# Patient Record
Sex: Female | Born: 1950 | Race: White | Hispanic: No | Smoking: Never smoker
Health system: Southern US, Community
[De-identification: ages and names within clinical notes are randomized; demographics above are authoritative.]

## PROBLEM LIST (undated history)

## (undated) DIAGNOSIS — M810 Age-related osteoporosis without current pathological fracture: Secondary | ICD-10-CM

## (undated) DIAGNOSIS — R7303 Prediabetes: Secondary | ICD-10-CM

## (undated) DIAGNOSIS — M1990 Unspecified osteoarthritis, unspecified site: Secondary | ICD-10-CM

## (undated) DIAGNOSIS — K5792 Diverticulitis of intestine, part unspecified, without perforation or abscess without bleeding: Secondary | ICD-10-CM

## (undated) DIAGNOSIS — E039 Hypothyroidism, unspecified: Secondary | ICD-10-CM

## (undated) DIAGNOSIS — I499 Cardiac arrhythmia, unspecified: Secondary | ICD-10-CM

## (undated) DIAGNOSIS — K219 Gastro-esophageal reflux disease without esophagitis: Secondary | ICD-10-CM

## (undated) HISTORY — DX: Age-related osteoporosis without current pathological fracture: M81.0

## (undated) HISTORY — DX: Cardiac arrhythmia, unspecified: I49.9

## (undated) HISTORY — PX: DILATION AND CURETTAGE OF UTERUS: SHX78

## (undated) HISTORY — DX: Diverticulitis of intestine, part unspecified, without perforation or abscess without bleeding: K57.92

## (undated) HISTORY — DX: Hypothyroidism, unspecified: E03.9

---

## 2005-02-27 ENCOUNTER — Ambulatory Visit: Payer: Self-pay | Admitting: Unknown Physician Specialty

## 2006-07-01 ENCOUNTER — Other Ambulatory Visit: Payer: Self-pay

## 2006-07-07 ENCOUNTER — Ambulatory Visit: Payer: Self-pay | Admitting: Unknown Physician Specialty

## 2006-07-09 ENCOUNTER — Ambulatory Visit: Payer: Self-pay | Admitting: Unknown Physician Specialty

## 2007-07-21 ENCOUNTER — Ambulatory Visit: Payer: Self-pay | Admitting: Unknown Physician Specialty

## 2008-07-21 ENCOUNTER — Ambulatory Visit: Payer: Self-pay | Admitting: Unknown Physician Specialty

## 2008-08-01 ENCOUNTER — Ambulatory Visit: Payer: Self-pay | Admitting: Unknown Physician Specialty

## 2008-08-21 ENCOUNTER — Ambulatory Visit: Payer: Self-pay | Admitting: Unknown Physician Specialty

## 2009-07-25 ENCOUNTER — Ambulatory Visit: Payer: Self-pay | Admitting: Unknown Physician Specialty

## 2010-08-05 ENCOUNTER — Ambulatory Visit: Payer: Self-pay | Admitting: Unknown Physician Specialty

## 2011-08-27 ENCOUNTER — Ambulatory Visit: Payer: Self-pay | Admitting: Unknown Physician Specialty

## 2012-02-02 ENCOUNTER — Ambulatory Visit: Payer: Self-pay | Admitting: Gastroenterology

## 2012-02-05 ENCOUNTER — Ambulatory Visit: Payer: Self-pay | Admitting: Gastroenterology

## 2017-01-05 ENCOUNTER — Other Ambulatory Visit: Payer: Self-pay | Admitting: Family Medicine

## 2017-01-05 DIAGNOSIS — Z1231 Encounter for screening mammogram for malignant neoplasm of breast: Secondary | ICD-10-CM

## 2017-02-03 ENCOUNTER — Ambulatory Visit
Admission: RE | Admit: 2017-02-03 | Discharge: 2017-02-03 | Disposition: A | Discharge status: Home / Self Care | Payer: Medicare HMO | Source: Ambulatory Visit | Attending: Family Medicine | Admitting: Family Medicine

## 2017-02-03 ENCOUNTER — Ambulatory Visit (INDEPENDENT_AMBULATORY_CARE_PROVIDER_SITE_OTHER): Payer: Medicare HMO | Admitting: Obstetrics & Gynecology

## 2017-02-03 ENCOUNTER — Encounter: Payer: Self-pay | Admitting: Obstetrics & Gynecology

## 2017-02-03 VITALS — BP 120/80 | HR 68 | Ht 63.0 in | Wt 149.0 lb

## 2017-02-03 DIAGNOSIS — Z0000 Encounter for general adult medical examination without abnormal findings: Secondary | ICD-10-CM

## 2017-02-03 DIAGNOSIS — Z1231 Encounter for screening mammogram for malignant neoplasm of breast: Secondary | ICD-10-CM | POA: Insufficient documentation

## 2017-02-03 NOTE — Progress Notes (Signed)
HPI:      Ms. Becky Marshall is a 66 y.o. [id number] who LMP was in the past, she presents today for her annual examination.  The patient has no complaints today. The patient is not sexually active.  Husband dies this past 12/12/2022.  Her last pap: was normal. No HPV done last PAP last year. The patient is not taking hormone replacement therapy. Patient denies post-menopausal vaginal bleeding.   The patient has regular exercise: yes.   GYN Hx: Last Colonoscopy: unclear. Normal.  Last DEXA: 2 years ago.  Osteopenia.  PMHx: She  has a past medical history of Irregular heart beat. Also,  has a past surgical history that includes Dilation and curettage of uterus., family history includes Breast cancer in her paternal aunt.,  reports that she has never smoked. She has never used smokeless tobacco. She reports that she drinks alcohol. She reports that she does not use drugs.  She has a current medication list which includes the following prescription(s): prenat-fecbn-febisg-fa-fishoil, aspirin ec, atenolol, calcium carb-cholecalciferol, cetirizine, b-12, loperamide, melatonin, and probiotic product. Also, is allergic to hydrocodone.  Review of Systems  Constitutional: Negative for chills, fever and malaise/fatigue.  HENT: Negative for congestion, sinus pain and sore throat.   Eyes: Negative for blurred vision and pain.  Respiratory: Negative for cough and wheezing.   Cardiovascular: Negative for chest pain and leg swelling.  Gastrointestinal: Negative for abdominal pain, constipation, diarrhea, heartburn, nausea and vomiting.  Genitourinary: Negative for dysuria, frequency, hematuria and urgency.  Musculoskeletal: Negative for back pain, joint pain, myalgias and neck pain.  Skin: Negative for itching and rash.  Neurological: Negative for dizziness, tremors and weakness.  Endo/Heme/Allergies: Does not bruise/bleed easily.  Psychiatric/Behavioral: Negative for depression. The patient is not  nervous/anxious and does not have insomnia.     Objective: BP 120/80   Pulse 68   Ht 5\' 3"  (1.6 m)   Wt 149 lb (67.6 kg)   BMI 26.39 kg/m  Physical Exam  Constitutional: She is oriented to person, place, and time. She appears well-developed and well-nourished. No distress.  Genitourinary: Rectum normal, vagina normal and uterus normal. Pelvic exam was performed with patient supine. There is no rash or lesion on the right labia. There is no rash or lesion on the left labia. Vagina exhibits no lesion. No bleeding in the vagina. Right adnexum does not display mass and does not display tenderness. Left adnexum does not display mass and does not display tenderness. Cervix does not exhibit motion tenderness, lesion, friability or polyp.   Uterus is mobile and midaxial. Uterus is not enlarged or exhibiting a mass.  HENT:  Head: Normocephalic and atraumatic. Head is without laceration.  Right Ear: Hearing normal.  Left Ear: Hearing normal.  Nose: No epistaxis.  No foreign bodies.  Mouth/Throat: Uvula is midline, oropharynx is clear and moist and mucous membranes are normal.  Eyes: Pupils are equal, round, and reactive to light.  Neck: Normal range of motion. Neck supple. No thyromegaly present.  Cardiovascular: Normal rate and regular rhythm.  Exam reveals no gallop and no friction rub.   No murmur heard. Pulmonary/Chest: Effort normal and breath sounds normal. No respiratory distress. She has no wheezes.  Abdominal: Soft. Bowel sounds are normal. She exhibits no distension. There is no tenderness. There is no rebound.  Musculoskeletal: Normal range of motion.  Neurological: She is alert and oriented to person, place, and time. No cranial nerve deficit.  Skin: Skin is warm and dry.  Psychiatric:  She has a normal mood and affect. Judgment normal.  Vitals reviewed.   Assessment: Annual Exam 1. Annual physical exam    Plan:            1.  Cervical Screening-  Pap smear done today  2.  Breast screening- Exam annually and mammogram done today  3. Colonoscopy every 10 years, Hemoccult testing yearly  4. Labs per PCP   5. Counseling for hormonal therapy: none\  6. DEXA due next year     F/U  Return in about 1 year (around 02/03/2018) for Annual with Mammogram.  Annamarie MajorPaul Ethin Drummond, MD, Merlinda FrederickFACOG [facility], [facility] 02/03/2017  2:00 PM

## 2017-02-06 LAB — IGP, APTIMA HPV
HPV Aptima: NEGATIVE
PAP Smear Comment: 0

## 2017-02-28 LAB — FECAL OCCULT BLOOD, IMMUNOCHEMICAL: FECAL OCCULT BLD: NEGATIVE

## 2017-08-13 NOTE — Discharge Instructions (Signed)
[facility] °[facility] ° °POST OPERATIVE INSTRUCTIONS FOR DR. TROXLER AND DR. FOWLER °[facility] ° ° °1. Take your medication as prescribed.  Pain medication should be taken only as needed. ° °2. Keep the dressing clean, dry and intact. ° °3. Keep your foot elevated above the heart level for the first 48 hours. ° °4. Walking to the bathroom and brief periods of walking are acceptable, unless we have instructed you to be non-weight bearing. ° °5. Always wear your post-op shoe when walking.  Always use your crutches if you are to be non-weight bearing. ° °6. Do not take a shower. Baths are permissible as long as the foot is kept out of the water.  ° °7. Every hour you are awake:  °- Bend your knee 15 times. °- Flex foot 15 times °- Massage calf 15 times ° °8. Call [facility] ([phone]) if any of the following problems occur: °- You develop a temperature or fever. °- The bandage becomes saturated with blood. °- Medication does not stop your pain. °- Injury of the foot occurs. °- Any symptoms of infection including redness, odor, or red streaks running from wound. ° ° °General Anesthesia, Adult, Care After °These instructions provide you with information about caring for yourself after your procedure. Your health care provider may also give you more specific instructions. Your treatment has been planned according to current medical practices, but problems sometimes occur. Call your health care provider if you have any problems or questions after your procedure. °What can I expect after the procedure? °After the procedure, it is common to have: °· Vomiting. °· A sore throat. °· Mental slowness. ° °It is common to feel: °· Nauseous. °· Cold or shivery. °· Sleepy. °· Tired. °· Sore or achy, even in parts of your body where you did not have surgery. ° °Follow these instructions at home: °For at least 24 hours after the procedure: °· Do not: °? Participate in  activities where you could fall or become injured. °? Drive. °? Use heavy machinery. °? Drink alcohol. °? Take sleeping pills or medicines that cause drowsiness. °? Make important decisions or sign legal documents. °? Take care of children on your own. °· Rest. °Eating and drinking °· If you vomit, drink water, juice, or soup when you can drink without vomiting. °· Drink enough fluid to keep your urine clear or pale yellow. °· Make sure you have little or no nausea before eating solid foods. °· Follow the diet recommended by your health care provider. °General instructions °· Have a responsible adult stay with you until you are awake and alert. °· Return to your normal activities as told by your health care provider. Ask your health care provider what activities are safe for you. °· Take over-the-counter and prescription medicines only as told by your health care provider. °· If you smoke, do not smoke without supervision. °· Keep all follow-up visits as told by your health care provider. This is important. °Contact a health care provider if: °· You continue to have nausea or vomiting at home, and medicines are not helpful. °· You cannot drink fluids or start eating again. °· You cannot urinate after 8-12 hours. °· You develop a skin rash. °· You have fever. °· You have increasing redness at the site of your procedure. °Get help right away if: °· You have difficulty breathing. °· You have chest pain. °· You have unexpected bleeding. °· You feel that you   are having a life-threatening or urgent problem. °This information is not intended to replace advice given to you by your health care provider. Make sure you discuss any questions you have with your health care provider. °Document Released: 02/09/2001 Document Revised: 04/07/2016 Document Reviewed: 10/18/2015 °Elsevier Interactive Patient Education © 2018 [employer] ° °

## 2017-08-14 ENCOUNTER — Encounter: Payer: Self-pay | Admitting: Anesthesiology

## 2017-08-20 ENCOUNTER — Ambulatory Visit: Payer: Medicare HMO | Admitting: Anesthesiology

## 2017-08-20 ENCOUNTER — Encounter
Admission: RE | Disposition: A | Discharge status: Home / Self Care | Payer: Self-pay | Source: Ambulatory Visit | Attending: Podiatry

## 2017-08-20 ENCOUNTER — Ambulatory Visit
Admission: RE | Admit: 2017-08-20 | Discharge: 2017-08-20 | Disposition: A | Discharge status: Home / Self Care | Payer: Medicare HMO | Source: Ambulatory Visit | Attending: Podiatry | Admitting: Podiatry

## 2017-08-20 DIAGNOSIS — Z79899 Other long term (current) drug therapy: Secondary | ICD-10-CM | POA: Insufficient documentation

## 2017-08-20 DIAGNOSIS — M19071 Primary osteoarthritis, right ankle and foot: Secondary | ICD-10-CM | POA: Insufficient documentation

## 2017-08-20 DIAGNOSIS — Z7982 Long term (current) use of aspirin: Secondary | ICD-10-CM | POA: Diagnosis not present

## 2017-08-20 DIAGNOSIS — K5730 Diverticulosis of large intestine without perforation or abscess without bleeding: Secondary | ICD-10-CM | POA: Diagnosis not present

## 2017-08-20 DIAGNOSIS — S93144D Subluxation of metatarsophalangeal joint of right lesser toe(s), subsequent encounter: Secondary | ICD-10-CM | POA: Diagnosis not present

## 2017-08-20 DIAGNOSIS — I10 Essential (primary) hypertension: Secondary | ICD-10-CM | POA: Insufficient documentation

## 2017-08-20 DIAGNOSIS — M2011 Hallux valgus (acquired), right foot: Secondary | ICD-10-CM

## 2017-08-20 DIAGNOSIS — Q6621 Congenital metatarsus primus varus: Secondary | ICD-10-CM | POA: Diagnosis present

## 2017-08-20 DIAGNOSIS — E0500 Thyrotoxicosis with diffuse goiter without thyrotoxic crisis or storm: Secondary | ICD-10-CM | POA: Insufficient documentation

## 2017-08-20 DIAGNOSIS — G8929 Other chronic pain: Secondary | ICD-10-CM | POA: Insufficient documentation

## 2017-08-20 HISTORY — PX: HALLUX VALGUS LAPIDUS: SHX6626

## 2017-08-20 HISTORY — DX: Prediabetes: R73.03

## 2017-08-20 HISTORY — DX: Unspecified osteoarthritis, unspecified site: M19.90

## 2017-08-20 HISTORY — PX: WEIL OSTEOTOMY: SHX5044

## 2017-08-20 HISTORY — DX: Gastro-esophageal reflux disease without esophagitis: K21.9

## 2017-08-20 HISTORY — PX: CAPSULOTOMY METATARSOPHALANGEAL: SHX6614

## 2017-08-20 SURGERY — BUNIONECTOMY, LAPIDUS
Anesthesia: Regional | Laterality: Right

## 2017-08-20 MED ORDER — LACTATED RINGERS IV SOLN: INTRAVENOUS | Status: DC

## 2017-08-20 MED ORDER — MIDAZOLAM HCL 5 MG/5ML IJ SOLN
INTRAMUSCULAR | Status: DC | PRN
  Administered 2017-08-20: 1 mg via INTRAVENOUS

## 2017-08-20 MED ORDER — ENOXAPARIN SODIUM 40 MG/0.4ML ~~LOC~~ SOLN: 40.0000 mg | SUBCUTANEOUS | 0 refills | Status: DC

## 2017-08-20 MED ORDER — ONDANSETRON HCL 4 MG/2ML IJ SOLN: 4.0000 mg | Freq: Once | INTRAMUSCULAR | Status: DC | PRN

## 2017-08-20 MED ORDER — DEXAMETHASONE SODIUM PHOSPHATE 4 MG/ML IJ SOLN
INTRAMUSCULAR | Status: DC | PRN
  Administered 2017-08-20: 4 mg via INTRAVENOUS
  Administered 2017-08-20: 4 mg via PERINEURAL

## 2017-08-20 MED ORDER — LIDOCAINE HCL (CARDIAC) 20 MG/ML IV SOLN
INTRAVENOUS | Status: DC | PRN
  Administered 2017-08-20: 40 mg via INTRATRACHEAL

## 2017-08-20 MED ORDER — PROPOFOL 10 MG/ML IV BOLUS
INTRAVENOUS | Status: DC | PRN
  Administered 2017-08-20: 130 mg via INTRAVENOUS

## 2017-08-20 MED ORDER — BUPIVACAINE HCL (PF) 0.25 % IJ SOLN
INTRAMUSCULAR | Status: DC | PRN
  Administered 2017-08-20: 10 mL

## 2017-08-20 MED ORDER — EPHEDRINE SULFATE 50 MG/ML IJ SOLN
INTRAMUSCULAR | Status: DC | PRN
  Administered 2017-08-20: 5 mg via INTRAVENOUS
  Administered 2017-08-20 (×2): 10 mg via INTRAVENOUS
  Administered 2017-08-20 (×5): 5 mg via INTRAVENOUS

## 2017-08-20 MED ORDER — ROPIVACAINE HCL 5 MG/ML IJ SOLN
INTRAMUSCULAR | Status: DC | PRN
  Administered 2017-08-20: 30 mL via PERINEURAL

## 2017-08-20 MED ORDER — FENTANYL CITRATE (PF) 100 MCG/2ML IJ SOLN: 25.0000 ug | INTRAMUSCULAR | Status: DC | PRN

## 2017-08-20 MED ORDER — LACTATED RINGERS IV SOLN
INTRAVENOUS | Status: DC
  Administered 2017-08-20 (×2): via INTRAVENOUS

## 2017-08-20 MED ORDER — ONDANSETRON HCL 4 MG/2ML IJ SOLN
INTRAMUSCULAR | Status: DC | PRN
  Administered 2017-08-20: 4 mg via INTRAVENOUS

## 2017-08-20 MED ORDER — OXYCODONE-ACETAMINOPHEN 7.5-325 MG PO TABS: 1.0000 | ORAL_TABLET | ORAL | 0 refills | Status: DC | PRN

## 2017-08-20 MED ORDER — ONDANSETRON HCL 4 MG PO TABS: 4.0000 mg | ORAL_TABLET | Freq: Three times a day (TID) | ORAL | 0 refills | Status: DC | PRN

## 2017-08-20 MED ORDER — CEFAZOLIN SODIUM-DEXTROSE 2-4 GM/100ML-% IV SOLN
2.0000 g | Freq: Once | INTRAVENOUS | Status: AC
  Administered 2017-08-20: 2 g via INTRAVENOUS

## 2017-08-20 MED ORDER — GLYCOPYRROLATE 0.2 MG/ML IJ SOLN
INTRAMUSCULAR | Status: DC | PRN
  Administered 2017-08-20: 0.1 mg via INTRAVENOUS

## 2017-08-20 SURGICAL SUPPLY — 100 items
0.9MM ×12 IMPLANT
BANDAGE ELASTIC 4 LF NS (GAUZE/BANDAGES/DRESSINGS) ×3 IMPLANT
BENZOIN TINCTURE PRP APPL 2/3 (GAUZE/BANDAGES/DRESSINGS) ×3 IMPLANT
BIT DRILL 1.7 LNG CANN (DRILL) ×3 IMPLANT
BIT DRILL 2 FENESTRATED (MISCELLANEOUS) ×1 IMPLANT
BIT DRILL 2.4X140 LONG SOLID (BIT) ×3 IMPLANT
BIT DRILL CANNULTD 2.6 X 130MM (DRILL) ×1 IMPLANT
BIT DRILL WIRE PASS (DRILL) ×1 IMPLANT
BIT DRILLL 2 FENESTRATED (MISCELLANEOUS) ×2
BLADE CRESCENTIC (BLADE) IMPLANT
BLADE MED AGGRESSIVE (BLADE) IMPLANT
BLADE MINI RND TIP GREEN BEAV (BLADE) ×3 IMPLANT
BLADE OSC/SAGITTAL 5.5X25 (BLADE) ×3 IMPLANT
BLADE OSC/SAGITTAL MD 5.5X18 (BLADE) IMPLANT
BLADE OSC/SAGITTAL MD 9X18.5 (BLADE) IMPLANT
BLADE OSCILLATING/SAGITTAL (BLADE)
BLADE SURG 15 STRL LF DISP TIS (BLADE) IMPLANT
BLADE SURG 15 STRL SS (BLADE)
BLADE SW THK.38XMED LNG THN (BLADE) IMPLANT
BNDG ESMARK 4X12 TAN STRL LF (GAUZE/BANDAGES/DRESSINGS) ×3 IMPLANT
BNDG GAUZE 4.5X4.1 6PLY STRL (MISCELLANEOUS) ×3 IMPLANT
BNDG STRETCH 4X75 STRL LF (GAUZE/BANDAGES/DRESSINGS) ×3 IMPLANT
BUR EGG 4X8 MED (BURR) IMPLANT
BUR STRYKR EGG 5.0 (BURR) IMPLANT
BUR SURG 4X8 MED (BURR) ×1 IMPLANT
BURR SURG 4MMX8MM MEDIUM (BURR) ×1
BURR SURG 4X8 MED (BURR) ×2
CANISTER SUCT 1200ML W/VALVE (MISCELLANEOUS) ×3 IMPLANT
CAST PADDING 3X4FT ST 30246 (SOFTGOODS)
CHLORAPREP W/TINT 26ML (MISCELLANEOUS) ×3 IMPLANT
CLOSURE WOUND 1/4X4 (GAUZE/BANDAGES/DRESSINGS) ×1
CNTRSNK DRL 2 HDLS SCR (MISCELLANEOUS) ×1 IMPLANT
COUNTERSICK 4.0 HEADED (MISCELLANEOUS) ×3
COUNTERSINK 2.0 (MISCELLANEOUS) ×2
COVER LIGHT HANDLE UNIVERSAL (MISCELLANEOUS) ×6 IMPLANT
COVER PIN YLW 0.028-062 (MISCELLANEOUS) IMPLANT
CUFF TOURN SGL QUICK 18 (TOURNIQUET CUFF) ×3 IMPLANT
DRAPE FLUOR MINI C-ARM 54X84 (DRAPES) ×3 IMPLANT
DRILL CANNULATED 2.6 X 130MM (DRILL) ×3
DRILL WIRE PASS (DRILL) ×3 IMPLANT
DURAPREP 26ML APPLICATOR (WOUND CARE) ×3 IMPLANT
ETHIBOND 2 0 GREEN CT 2 30IN (SUTURE) ×3 IMPLANT
GAUZE PETRO XEROFOAM 1X8 (MISCELLANEOUS) ×3 IMPLANT
GAUZE SPONGE 4X4 12PLY STRL (GAUZE/BANDAGES/DRESSINGS) ×3 IMPLANT
GLOVE BIO SURGEON STRL SZ8 (GLOVE) ×3 IMPLANT
GOWN STRL REUS W/ TWL LRG LVL3 (GOWN DISPOSABLE) IMPLANT
GOWN STRL REUS W/ TWL XL LVL3 (GOWN DISPOSABLE) ×2 IMPLANT
GOWN STRL REUS W/TWL LRG LVL3 (GOWN DISPOSABLE)
GOWN STRL REUS W/TWL XL LVL3 (GOWN DISPOSABLE) ×4
IV NS 250ML (IV SOLUTION)
IV NS 250ML BAXH (IV SOLUTION) IMPLANT
K-WIRE DBL END TROCAR 6X.045 (WIRE)
K-WIRE DBL END TROCAR 6X.062 (WIRE)
K-WIRE SNGL END 1.2X150 (MISCELLANEOUS) ×30
KIT ROOM TURNOVER OR (KITS) ×3 IMPLANT
KWIRE DBL END TROCAR 6X.045 (WIRE) IMPLANT
KWIRE DBL END TROCAR 6X.062 (WIRE) IMPLANT
KWIRE SNGL END 1.2X150 (MISCELLANEOUS) ×10 IMPLANT
NEEDLE HYPO 18GX1.5 BLUNT FILL (NEEDLE) IMPLANT
NEEDLE HYPO 25GX1X1/2 BEV (NEEDLE) IMPLANT
NS IRRIG 500ML POUR BTL (IV SOLUTION) ×3 IMPLANT
PACK EXTREMITY ARMC (MISCELLANEOUS) ×3 IMPLANT
PAD CAST CTTN 3X4 STRL (SOFTGOODS) IMPLANT
PAD GROUND ADULT SPLIT (MISCELLANEOUS) ×3 IMPLANT
PADDING CAST BLEND 4X4 NS (MISCELLANEOUS) ×3 IMPLANT
PLATE STD RIGHT LAPIDUS (Plate) ×1 IMPLANT
RASP SM TEAR CROSS CUT (RASP) ×3 IMPLANT
SCREW 3.5X16 NONLOCKING (Screw) ×3 IMPLANT
SCREW CANNULATED 4.0X36S ×3 IMPLANT
SCREW COUNTERSINK 4.0 HEADED (MISCELLANEOUS) ×1 IMPLANT
SCREW HEADLESS SHRT THRD 2X10 (Screw) ×3 IMPLANT
SCREW HEADLESS SHRT THRD 2X12 (Screw) ×9 IMPLANT
SCREW LOCK PLATE R3 3.5X14 (Screw) ×6 IMPLANT
SCREW LOCK PLATE R3 3.5X16 (Screw) ×6 IMPLANT
SPLINT CAST 1 STEP 4X30 (MISCELLANEOUS) ×3 IMPLANT
SPLINT FAST PLASTER 5X30 (CAST SUPPLIES)
SPLINT PLASTER CAST FAST 5X30 (CAST SUPPLIES) IMPLANT
STANDARD R LAPIDUS PLATE (Plate) ×3 IMPLANT
STOCKINETTE STRL 6IN 960660 (GAUZE/BANDAGES/DRESSINGS) ×3 IMPLANT
STRAP BODY AND KNEE 60X3 (MISCELLANEOUS) ×3 IMPLANT
STRIP CLOSURE SKIN 1/4X4 (GAUZE/BANDAGES/DRESSINGS) ×2 IMPLANT
SUT ETHILON 4-0 (SUTURE)
SUT ETHILON 4-0 FS2 18XMFL BLK (SUTURE)
SUT ETHILON 5-0 FS-2 18 BLK (SUTURE) ×3 IMPLANT
SUT SILK 2 0 (SUTURE)
SUT SILK 2-0 30XBRD TIE 12 (SUTURE) IMPLANT
SUT VIC AB 1 CT1 36 (SUTURE) IMPLANT
SUT VIC AB 2-0 CT1 27 (SUTURE)
SUT VIC AB 2-0 CT1 TAPERPNT 27 (SUTURE) IMPLANT
SUT VIC AB 2-0 SH 27 (SUTURE)
SUT VIC AB 2-0 SH 27XBRD (SUTURE) IMPLANT
SUT VIC AB 3-0 SH 27 (SUTURE) ×2
SUT VIC AB 3-0 SH 27X BRD (SUTURE) ×1 IMPLANT
SUT VIC AB 4-0 FS2 27 (SUTURE) ×6 IMPLANT
SUT VICRYL AB 3-0 FS1 BRD 27IN (SUTURE) ×3 IMPLANT
SUTURE ETHLN 4-0 FS2 18XMF BLK (SUTURE) IMPLANT
SYRINGE 10CC LL (SYRINGE) IMPLANT
WAND TENDON TOPAZ 0 ANGL (MISCELLANEOUS) IMPLANT
WIRE OLIVE SMOOTH 1.4MMX60MM (WIRE) ×12 IMPLANT
kwire ×6 IMPLANT

## 2017-08-20 NOTE — Anesthesia Postprocedure Evaluation (Signed)
Anesthesia Post Note  Patient: Becky Marshall  Procedure(s) Performed: HALLUX VALGUS LAPIDUS (Right ) WEIL OSTEOTOMY-2ND & 3RD TOES (Right ) CAPSULOTOMY METATARSOPHALANGEAL-2ND & 3RD (Right )  Patient location during evaluation: PACU Anesthesia Type: Regional Level of consciousness: awake and alert Pain management: pain level controlled Vital Signs Assessment: post-procedure vital signs reviewed and stable Respiratory status: spontaneous breathing Cardiovascular status: blood pressure returned to baseline Postop Assessment: no headache Anesthetic complications: no    Verner Chol, III,  Gabrielle Wakeland D

## 2017-08-20 NOTE — H&P (Signed)
H and P has been reviewed and no changes are noted.  

## 2017-08-20 NOTE — Transfer of Care (Signed)
Immediate Anesthesia Transfer of Care Note  Patient: Becky Marshall  Procedure(s) Performed: HALLUX VALGUS LAPIDUS (Right ) WEIL OSTEOTOMY-2ND & 3RD TOES (Right ) CAPSULOTOMY METATARSOPHALANGEAL-2ND & 3RD (Right )  Patient Location: PACU  Anesthesia Type: General LMA, Regional  Level of Consciousness: awake, alert  and patient cooperative  Airway and Oxygen Therapy: Patient Spontanous Breathing and Patient connected to supplemental oxygen  Post-op Assessment: Post-op Vital signs reviewed, Patient's Cardiovascular Status Stable, Respiratory Function Stable, Patent Airway and No signs of Nausea or vomiting  Post-op Vital Signs: Reviewed and stable  Complications: No apparent anesthesia complications

## 2017-08-20 NOTE — Anesthesia Procedure Notes (Signed)
Anesthesia Regional Block: Popliteal block   Pre-Anesthetic Checklist: ,, timeout performed, Correct Patient, Correct Site, Correct Laterality, Correct Procedure, Correct Position, site marked, Risks and benefits discussed,  Surgical consent,  Pre-op evaluation,  At surgeon's request and post-op pain management  Laterality: Right  Prep: chloraprep       Needles:  Injection technique: Single-shot  Needle Type: Stimiplex      Needle Gauge: 21     Additional Needles:   Procedures:,,,, ultrasound used (permanent image in chart),,,,  Narrative:  Start time: 08/20/2017 7:25 AM End time: 08/20/2017 7:30 AM Injection made incrementally with aspirations every 30 mL.  Performed by: Personally  Anesthesiologist: Jolayne Panther

## 2017-08-20 NOTE — Anesthesia Preprocedure Evaluation (Signed)
Anesthesia Evaluation  Patient identified by MRN, date of birth, ID band Patient awake    Reviewed: Allergy & Precautions, H&P , Patient's Chart, lab work & pertinent test results  Airway Mallampati: II  TM Distance: >3 FB Neck ROM: full    Dental no notable dental hx.    Pulmonary neg pulmonary ROS,    Pulmonary exam normal        Cardiovascular negative cardio ROS Normal cardiovascular exam     Neuro/Psych    GI/Hepatic Neg liver ROS, Medicated,  Endo/Other  negative endocrine ROS  Renal/GU negative Renal ROS     Musculoskeletal   Abdominal   Peds  Hematology negative hematology ROS (+)   Anesthesia Other Findings   Reproductive/Obstetrics negative OB ROS                            Anesthesia Physical Anesthesia Plan  ASA: II  Anesthesia Plan: General LMA and Regional   Post-op Pain Management: GA combined w/ Regional for post-op pain   Induction:   PONV Risk Score and Plan:   Airway Management Planned:   Additional Equipment:   Intra-op Plan:   Post-operative Plan:   Informed Consent: I have reviewed the patients History and Physical, chart, labs and discussed the procedure including the risks, benefits and alternatives for the proposed anesthesia with the patient or authorized representative who has indicated his/her understanding and acceptance.     Plan Discussed with:   Anesthesia Plan Comments:         Anesthesia Quick Evaluation

## 2017-08-20 NOTE — Anesthesia Procedure Notes (Signed)
Procedure Name: LMA Insertion Date/Time: 08/20/2017 7:49 AM Performed by: Jimmy Picket Pre-anesthesia Checklist: Patient identified, Emergency Drugs available, Suction available, Timeout performed and Patient being monitored Patient Re-evaluated:Patient Re-evaluated prior to induction Oxygen Delivery Method: Circle system utilized Preoxygenation: Pre-oxygenation with 100% oxygen Induction Type: IV induction LMA: LMA inserted LMA Size: 4.0 Number of attempts: 1 Placement Confirmation: positive ETCO2 and breath sounds checked- equal and bilateral Tube secured with: Tape

## 2017-08-20 NOTE — Progress Notes (Signed)
Assisted Daniel Runkle ANMD with right, ultrasound guided, popliteal block. Side rails up, monitors on throughout procedure. See vital signs in flow sheet. Tolerated Procedure well.  

## 2017-08-20 NOTE — Op Note (Signed)
Operative note   Surgeon: Dr. Albertine Patricia, DPM.    Assistant: None    Preop diagnosis: 1. 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 Osteoarthritis first metatarsocuneiform joint right foot. 3. Plantar displaced second metatarsal right foot 4. Plantar displaced third metatarsal right foot 5. Ruptured second metatarsal phalangeal joint plantar plate with dorsal dislocation of the proximal phalanx 6. Ruptured third metatarsal phalangeal joint plantar plate with dorsal dislocation of the proximal phalanx base    Postop diagnosis: Same    Procedure:   1. Hallux abductovalgus correction with Lapidus fusion of the first met cuneiform joint right foot   2. Osteotomy second metatarsal right foot   3. Osteotomy third metatarsal right foot    4. Plantar plate repair through the dorsal second MTPJ incision with anchoring of the plate to the proximal phalanx base 5. Plantar plate repair through the dorsal MTPJ incision of the third MTP joint with anchoring to the proximal phalanx base    EBL: 15 cc    Anesthesia:general delivered by anesthesia team with popliteal block preoperatively also delivered by anesthesia team. I block the dorsal aspect of the foot at the end of the case with 0.25% Marcaine plain 10 cc    Hemostasis: Thigh tourniquet at 250 mils mercury pressure. This was initially inflated for 70 minutes then dropped for 10 minutes then inflated again for another 70 minutes before dropping again. Then inflated for 30 minutes. Before dropping permanently. At each phase prompt complete vascularity was seen to return to all the digits.    Specimen: None    Complications: None    Operative indications: Patient chronic pain to the right foot she seen more deformity the toes over the last 5 or 6 years unresponsive to conservative care    Procedure:  Patient was brought into the OR and placed on the operating table in thesupine position. After anesthesia was obtained  theright lower extremity was prepped and draped in usual sterile fashion.  Operative Report: At this time to his directed to the dorsum of the right foot where a 7 cm dorsolinear skin incision made over the first met cuneiform joint extended distally over the first MTP joint. Incision was deepened sharp dissection bleeders clamped and bovied as required. At the MTP joint level the capsule tissue was identified and incised longitudinally reflected with the medial aspect of the metatarsal head were large eminence of bone was noted. This resected and rasped smoothly to this timeframe. Next the incision was deepened at the met cuneiform joint level where tissues were retracted mediolaterally. First met cuneiform joint was identified and a cutting guide was used from the Paragon Lapidus set to allow for resection of the articular cartilage from both aspects of the joint. This time the cutting guide was anchored and checked FluoroScan good position was noted the cuts were then made and the articular cartilage was removed with set. A spreader was placed across each side so that good visualization could be accomplished and the residual bone and cartilage was removed. Curettage was used to remove the [place] portion of cartilage from the base of the metatarsal as well as a bur. Once good raw bone was noted on each side the area was then drilled multiple times and fenestrated. Once this was accomplished the 2 portions of bone were reapproximated and the first metatarsal was rotated in the frontal plane and temporarily pinned. There is checked forcing good position and correction were noted with good reduction of the IM  angle as well as the sesamoid position was improved considerably. At this time the plating system was placed in the correct position fixated with 2 olive wires and the 40 screw was then positioned across the first metatarsal dorsally to proximal plantar through the medial cuneiform. Once this was in position  and fixated there is checked FluoroScan good position and fixation were noted. This point the plate was then fixated dorsal medial and medial to the fused joint area. With 2 screws across the distal portion and one screw at the medial distal portion. 2 screws were also placed proximally. There is checked FluoroScan excellent position correction were noted. There is a complete closure of the plantar gap that had been present originally and good alignment in all planes. There is an copiously irrigated.  This time to his directed to the lateral aspect of the first MTP joint where a lateral capsulotomy was then performed and a fibular sesamoidal ligament release was performed. A medial capsulorrhaphy was then performed to help with position of the proximal phalanx on the metatarsal head. There is checked FluoroScan good position correction were noted this timeframe.  This time remaining capsule tissue was closed with 4 Vicryl in continuous stitch as were deep superficial fascial layers. Skin was closed with 4 Vicryl in a subcuticular stitch.  Procedure #2 and 3.:  A curvilinear incision made over the dorsum of the second metatarsal phalangeal joint extended up onto the toe. Incision was deepened sharp blunt dissection bleeders clamped and bovied as required. Extension was identified and extensor hood release was performed of the tendon was retracted laterally. Incision made through the periosteum Tissue over the distal metatarsal and metatarsophalangeal joint extending up onto the base of the proximal phalanx. This tissues and freed mediolaterally and 15 blade. There is a this time that the proximal phalanx was dislocated and sitting dorsally on the metatarsal head. Family elevator was used to free up tissue and an osteotomy was then performed on second metatarsal at the osteochondral junction from dorsal distal to plantar proximal. On the second metatarsal a secondary small shaver was accomplished to allow  dorsiflexion and prevent excess plantarflexion with shortening of the osteotomy. At this time a spreader was used with 2 pins to open up the MTP joint more and it was clear that a plantar plate rupture had occurred. The flexor tendons were identified and present within the MTP joint visibility. The proximal portion of the ruptured plate was then identified and a 2-0 nonabsorbable suture was utilized to pick the proximal portion of the torn plantar plate medially and then this was also used to catch the proximal lateral aspect of it and this portion of the plantar plate was isolated in this fashion. At this 0.2 drill holes were placed across proximal phalanx base with 1.5 wire pass drill bit. The ends of the 2-0 suture were then placed across the hole in the wire pass drill and brought from plantar to dorsal at the base of the proximal phalanx. This was accomplished on both drill holes with both ends of the suture. Suture was then used to tighten down the proximal phalanx base and to bring the damaged portion of plate back up to a point where it would cover the tendons and reattached to the proximal phalanx base. Once this was accomplished the toe was seen to sit in a much better position with no further contracture across the PIP joint of the toe. The alignment in the sagittal plane and frontal plane and  transverse planes was excellent on the area. There is checked FluoroScan good position correction were noted. This point the osteotomy was positioned temporally fixated with 2 K wires and there is checked FluoroScan good position correction were noted and then 2 of the screws from the mini monster Paragon set were placed across the osteotomy. At this time some 3-0 Vicryl was used to further repair the plantar plate. After copious irrigation the periosteal tissues and closed over the second metatarsal phalangeal joint with 4 Vicryl in continuous stitch deep suprafascial layers closed with 4 Vicryl in continuous  stitch. Skin was closed with 4 Vicryl subcuticular fashion. A K wire was then run across the toe starting distally and ran through the distal middle proximal phalanges and retrograded into the metatarsal head and shaft to help hold the toe in a better overall position as well and secured the area and a good position.  Procedures 4 and 5: This time attention was directed to the third metatarsal phalangeal joint where identical procedure was performed.  After the Marcaine block a sterile compressive dressings placed across the wounds consisting of Xeroform gauze Steri-Strips 4 x 4's Kling and Kerlix tourniquet was released as noted earlier.    Patient tolerated the procedure and anesthesia well.  Was transported from the OR to the PACU with all vital signs stable and vascular status intact. To be discharged per routine protocol.  Will follow up in approximately 1 week in the outpatient clinic.

## 2017-08-21 ENCOUNTER — Encounter: Payer: Self-pay | Admitting: Podiatry

## 2018-06-02 ENCOUNTER — Ambulatory Visit (INDEPENDENT_AMBULATORY_CARE_PROVIDER_SITE_OTHER): Payer: Medicare HMO | Admitting: Obstetrics & Gynecology

## 2018-06-02 ENCOUNTER — Encounter: Payer: Self-pay | Admitting: Obstetrics & Gynecology

## 2018-06-02 VITALS — BP 132/80 | HR 73 | Ht 62.5 in | Wt 163.0 lb

## 2018-06-02 DIAGNOSIS — Z1231 Encounter for screening mammogram for malignant neoplasm of breast: Secondary | ICD-10-CM

## 2018-06-02 DIAGNOSIS — Z8739 Personal history of other diseases of the musculoskeletal system and connective tissue: Secondary | ICD-10-CM | POA: Diagnosis not present

## 2018-06-02 DIAGNOSIS — Z01419 Encounter for gynecological examination (general) (routine) without abnormal findings: Secondary | ICD-10-CM | POA: Diagnosis not present

## 2018-06-02 DIAGNOSIS — Z0000 Encounter for general adult medical examination without abnormal findings: Secondary | ICD-10-CM

## 2018-06-02 DIAGNOSIS — Z1239 Encounter for other screening for malignant neoplasm of breast: Secondary | ICD-10-CM

## 2018-06-02 NOTE — Patient Instructions (Signed)
PAP every three years Mammogram every year    Call [phone] to schedule at [facility] [place] every 10 [facility]- schedule soon Labs yearly (with PCP)

## 2018-06-02 NOTE — Progress Notes (Signed)
HPI:      Becky Marshall is a 67 y.o. [id number] who LMP was in the past, she presents today for her annual examination.  The patient has no complaints today. The patient is not currently sexually active. Herlast pap: approximate date 2018 and was normal and last mammogram: approximate date 2018 and was normal.  The patient does perform self breast exams.  There is no notable family history of breast or ovarian cancer in her family. The patient is not taking hormone replacement therapy. Patient denies post-menopausal vaginal bleeding.   The patient has regular exercise: yes. The patient denies current symptoms of depression.    GYN Hx: Last Colonoscopy:several years ago. Normal.  Last DEXA: 2 years ago.    PMHx: Past Medical History:  Diagnosis Date  . Arthritis   . Diverticulitis   . GERD (gastroesophageal reflux disease)   . Irregular heart beat   . Pre-diabetes    Past Surgical History:  Procedure Laterality Date  . CAPSULOTOMY METATARSOPHALANGEAL Right 08/20/2017   Procedure: CAPSULOTOMY METATARSOPHALANGEAL-2ND & 3RD;  Surgeon: Recardo Evangelistroxler, Matthew, DPM;  [facility];  Service: Podiatry;  Laterality: Right;  . DILATION AND CURETTAGE OF UTERUS    . HALLUX VALGUS LAPIDUS Right 08/20/2017   Procedure: HALLUX VALGUS LAPIDUS;  Surgeon: Recardo Evangelistroxler, Matthew, DPM;  [facility];  Service: Podiatry;  Laterality: Right;  . WEIL OSTEOTOMY Right 08/20/2017   Procedure: WEIL OSTEOTOMY-2ND & 3RD TOES;  Surgeon: Recardo Evangelistroxler, Matthew, DPM;  [facility];  Service: Podiatry;  Laterality: Right;   Family History  Problem Relation Age of Onset  . Breast cancer Paternal Aunt    Social History   Tobacco Use  . Smoking status: Never Smoker  . Smokeless tobacco: Never Used  Substance Use Topics  . Alcohol use: Yes    Alcohol/week: 3.0 oz    Types: 5 Cans of beer per week  . Drug use: No    Current Outpatient Medications:  .  Acetaminophen (TYLENOL  ARTHRITIS PAIN PO), Take 650 mg by mouth., Disp: , Rfl:  .  aspirin EC 81 MG tablet, Take by mouth., Disp: , Rfl:  .  atenolol (TENORMIN) 25 MG tablet, , Disp: , Rfl:  .  Calcium Carb-Cholecalciferol (CALCIUM 1000 + D PO), Take 750 mg by mouth. , Disp: , Rfl:  .  chlorpheniramine (CHLOR-TRIMETON) 4 MG tablet, Take 4 mg by mouth every 4 (four) hours as needed for allergies., Disp: , Rfl:  .  Cranberry 500 MG CAPS, Take 500 mg by mouth., Disp: , Rfl:  .  Flaxseed, Linseed, (FLAXSEED OIL) 1000 MG CAPS, Take by mouth., Disp: , Rfl:  .  fluticasone (FLONASE) 50 MCG/ACT nasal spray, Place 1 spray into both nostrils daily., Disp: , Rfl:  .  loperamide (IMODIUM A-D) 2 MG tablet, Take 2 mg by mouth. , Disp: , Rfl:  .  Magnesium 250 MG TABS, Take by mouth., Disp: , Rfl:  .  Melatonin 5 MG CAPS, Take by mouth., Disp: , Rfl:  .  metroNIDAZOLE (METROGEL) 0.75 % gel, USE 1 APPLICATION (THIN LAYER) TO FACE TWICE A DAY, Disp: , Rfl: 3 .  omega-3 fish oil (MAXEPA) 1000 MG CAPS capsule, Take 1 capsule by mouth 2 (two) times daily., Disp: , Rfl:  .  omeprazole (PRILOSEC) 20 MG capsule, Take by mouth., Disp: , Rfl:  .  Probiotic Product (PROBIOTIC-10 PO), Take by mouth., Disp: , Rfl:  .  pyridOXINE (VITAMIN B-6) 25 MG tablet,  Take by mouth., Disp: , Rfl:  .  cetirizine (ZYRTEC) 10 MG tablet, Take by mouth., Disp: , Rfl:  .  Cobalamine Combinations (B-12) 1000-400 MCG SUBL, Place under the tongue., Disp: , Rfl:  .  enoxaparin (LOVENOX) 40 MG/0.4ML injection, Inject 0.4 mLs (40 mg total) into the skin daily. (Patient not taking: Reported on 06/02/2018), Disp: 5 Syringe, Rfl: 0 .  omega-3 acid ethyl esters (LOVAZA) 1 g capsule, Take by mouth., Disp: , Rfl:  .  PRENAT-FECBN-FEBISG-FA-FISHOIL PO, Take by mouth., Disp: , Rfl:  .  Probiotic Product (PROBIOTIC-10 PO), Take by mouth., Disp: , Rfl:  .  ranitidine (ZANTAC) 150 MG capsule, Take 150 mg by mouth 2 (two) times daily., Disp: , Rfl:  Allergies:  Hydrocodone  Review of Systems  Constitutional: Negative for chills, fever and malaise/fatigue.  HENT: Negative for congestion, sinus pain and sore throat.   Eyes: Negative for blurred vision and pain.  Respiratory: Negative for cough and wheezing.   Cardiovascular: Negative for chest pain and leg swelling.  Gastrointestinal: Negative for abdominal pain, constipation, diarrhea, heartburn, nausea and vomiting.  Genitourinary: Negative for dysuria, frequency, hematuria and urgency.  Musculoskeletal: Negative for back pain, joint pain, myalgias and neck pain.  Skin: Negative for itching and rash.  Neurological: Negative for dizziness, tremors and weakness.  Endo/Heme/Allergies: Does not bruise/bleed easily.  Psychiatric/Behavioral: Negative for depression. The patient is not nervous/anxious and does not have insomnia.     Objective: BP 132/80   Pulse 73   Ht 5' 2.5" (1.588 m)   Wt 163 lb (73.9 kg)   BMI 29.34 kg/m   Filed Weights   06/02/18 1425  Weight: 163 lb (73.9 kg)   Body mass index is 29.34 kg/m. Physical Exam  Constitutional: She is oriented to person, place, and time. She appears well-developed and well-nourished. No distress.  Genitourinary: Rectum normal, vagina normal and uterus normal. Pelvic exam was performed with patient supine. There is no rash or lesion on the right labia. There is no rash or lesion on the left labia. Vagina exhibits no lesion. No bleeding in the vagina. Right adnexum does not display mass and does not display tenderness. Left adnexum does not display mass and does not display tenderness. Cervix does not exhibit motion tenderness, lesion, friability or polyp.   Uterus is mobile and midaxial. Uterus is not enlarged or exhibiting a mass.  HENT:  Head: Normocephalic and atraumatic. Head is without laceration.  Right Ear: Hearing normal.  Left Ear: Hearing normal.  Nose: No epistaxis.  No foreign bodies.  Mouth/Throat: Uvula is midline, oropharynx is  clear and moist and mucous membranes are normal.  Eyes: Pupils are equal, round, and reactive to light.  Neck: Normal range of motion. Neck supple. No thyromegaly present.  Cardiovascular: Normal rate and regular rhythm. Exam reveals no gallop and no friction rub.  No murmur heard. Pulmonary/Chest: Effort normal and breath sounds normal. No respiratory distress. She has no wheezes. Right breast exhibits no mass, no skin change and no tenderness. Left breast exhibits no mass, no skin change and no tenderness.  Abdominal: Soft. Bowel sounds are normal. She exhibits no distension. There is no tenderness. There is no rebound.  Musculoskeletal: Normal range of motion.  Neurological: She is alert and oriented to person, place, and time. No cranial nerve deficit.  Skin: Skin is warm and dry.  Psychiatric: She has a normal mood and affect. Judgment normal.  Vitals reviewed.   Assessment: Annual Exam 1. Annual physical  exam   2. Screening for breast cancer   3. History of osteopenia     Plan:            1.  Cervical Screening-  Pap smear schedule reviewed with patient  2. Breast screening- Exam annually and mammogram scheduled  3. Colonoscopy every 10 years, Hemoccult testing after age 62. Pt to schedule at [facility].  4. Labs managed by PCP  5. Counseling for hormonal therapy: none  6. DEXA scheduled    F/U  Return in about 1 year (around 06/03/2019) for Annual.  Annamarie Major, MD, [facility], [facility] 06/02/2018  3:10 PM

## 2018-06-08 ENCOUNTER — Telehealth: Payer: Self-pay | Admitting: Obstetrics & Gynecology

## 2018-06-08 NOTE — Telephone Encounter (Signed)
Patient is aware that per [facility] [employer], DEXA [id number] is covered at 100%, no copay, no ded, whether in or out of network, Ref# [id number]. Patient is aware of appointment for DEXA and screening mamm[facility] at [facility] on Thursday, 07/01/18 @ 10:20am. Patient is aware to avoid wearing metal on her clothing, and to avoid lotions, powders, or deoderants.

## 2018-06-08 NOTE — Telephone Encounter (Signed)
-----   Message from Nadara Mustardobert P Harris, MD sent at 06/02/2018  3:08 PM EDT ----- Regarding: DEXA I have [facility] and pt wishes to know if it is more expensive there vs outpt location based on her ins/Medicare,  Do u have way of finding this out? Thx

## 2018-07-01 ENCOUNTER — Ambulatory Visit
Admission: RE | Admit: 2018-07-01 | Discharge: 2018-07-01 | Disposition: A | Discharge status: Home / Self Care | Payer: Medicare HMO | Source: Ambulatory Visit | Attending: Obstetrics & Gynecology | Admitting: Obstetrics & Gynecology

## 2018-07-01 ENCOUNTER — Telehealth: Payer: Self-pay | Admitting: Obstetrics & Gynecology

## 2018-07-01 DIAGNOSIS — M810 Age-related osteoporosis without current pathological fracture: Secondary | ICD-10-CM | POA: Insufficient documentation

## 2018-07-01 DIAGNOSIS — Z1231 Encounter for screening mammogram for malignant neoplasm of breast: Secondary | ICD-10-CM | POA: Insufficient documentation

## 2018-07-01 DIAGNOSIS — Z8739 Personal history of other diseases of the musculoskeletal system and connective tissue: Secondary | ICD-10-CM | POA: Diagnosis not present

## 2018-07-01 DIAGNOSIS — Z1239 Encounter for other screening for malignant neoplasm of breast: Secondary | ICD-10-CM

## 2018-07-01 NOTE — Telephone Encounter (Signed)
Notes recorded by Nadara MustardHarris, Robert P, MD on 07/01/2018 at 3:43 PM EDT Plz arrange for appt to discuss DEXA results here.  Patient is [facility]05/18 10:50 [facility]

## 2018-07-01 NOTE — Progress Notes (Signed)
Plz arrange for appt to discuss DEXA results here

## 2018-07-02 NOTE — Progress Notes (Signed)
Can you call pt to schedule appointment please

## 2018-07-05 ENCOUNTER — Encounter: Payer: Self-pay | Admitting: Obstetrics & Gynecology

## 2018-07-05 ENCOUNTER — Ambulatory Visit (INDEPENDENT_AMBULATORY_CARE_PROVIDER_SITE_OTHER): Payer: Medicare HMO | Admitting: Obstetrics & Gynecology

## 2018-07-05 VITALS — BP 120/80 | Ht 62.0 in | Wt 162.0 lb

## 2018-07-05 DIAGNOSIS — M810 Age-related osteoporosis without current pathological fracture: Secondary | ICD-10-CM

## 2018-07-05 MED ORDER — RALOXIFENE HCL 60 MG PO TABS: 60.0000 mg | ORAL_TABLET | Freq: Every day | ORAL | 11 refills | Status: DC

## 2018-07-05 NOTE — Patient Instructions (Signed)
Raloxifene tablets What is this medicine? RALOXIFENE (ral OX i feen) reduces the amount of calcium lost from bones. It is used to treat and prevent osteoporosis in women who have experienced menopause. It may also help prevent invasive breast cancer in certain women who have a high risk for breast cancer. This medicine may be used for other purposes; ask your health care provider or pharmacist if you have questions. COMMON BRAND NAME(S): Evista What should I tell my health care provider before I take this medicine? They need to know if you have any of these conditions: -a history of blood clots -cancer -heart disease or recent heart attack -high levels of triglycerides (blood fat) in the blood -history of stroke -kidney disease -liver disease -premenopausal -smoke tobacco -an unusual or allergic reaction to raloxifene, other medicines, foods, dyes, or preservatives -pregnant or trying to get pregnant -breast-feeding How should I use this medicine? Take this medicine by mouth with a glass of water. Follow the directions on the prescription label. The tablets can be taken with or without food. Take your doses at regular intervals. Do not take your medicine more often than directed. A special MedGuide will be given to you by the pharmacist with each prescription and refill. Be sure to read this information carefully each time. Talk to your pediatrician regarding the use of this medicine in children. Special care may be needed. Overdosage: If you think you have taken too much of this medicine contact a poison control center or emergency room at once. NOTE: This medicine is only for you. Do not share this medicine with others. What if I miss a dose? If you miss a dose, take it as soon as you can. If it is almost time for your next dose, take only that dose. Do not take double or extra doses. What may interact with this medicine? -cholestyramine -female hormones, like  estrogens -warfarin This list may not describe all possible interactions. Give your health care provider a list of all the medicines, herbs, non-prescription drugs, or dietary supplements you use. Also tell them if you smoke, drink alcohol, or use illegal drugs. Some items may interact with your medicine. What should I watch for while using this medicine? Visit your doctor or health care professional for regular checks on your progress. Do not stop taking this medicine except on the advice of your doctor or health care professional. If you are taking this medicine to reduce your risk of getting breast cancer, you should know that this medicine does not prevent all types of breast cancer. Talk to your doctor if you have questions. This medicine does not prevent hot flashes. It may cause hot flashes in some patients at the start of therapy. You should make sure that you get enough calcium and vitamin D while you are taking this medicine. Discuss the foods you eat and the vitamins you take with your health care professional. Exercise may help to prevent bone loss. Discuss your exercise needs with your doctor or health care professional. This medicine can rarely cause blood clots. If you are going to have surgery, tell your doctor or health care professional that you are taking this medicine. This medicine should be stopped at least 3 days before surgery. After surgery, it should be restarted only after you are walking again. It should not be restarted while you still need long periods of bed rest. You should not smoke while taking this medicine. Smoking may increase your risk of blood clots or stroke.   If you have any reason to think you are pregnant; stop taking this medicine at once and contact your doctor or health care professional. Do not breast feed while taking this medicine. What side effects may I notice from receiving this medicine? Side effects that you should report to your doctor or health  care professional as soon as possible: -allergic reactions like skin rash, itching or hives, swelling of the face, lips, or tongue) -breast tissue changes or discharge -signs and symptoms of a blood clot such as breathing problems; changes in vision; chest pain; severe, sudden headache; pain, swelling, warmth in the leg; trouble speaking; sudden numbness or weakness of the face, arm or leg -signs and symptoms of a stroke like changes in vision; confusion; trouble speaking or understanding; severe headaches; sudden numbness or weakness of the face, arm or leg; trouble walking; dizziness; loss of balance or coordination -vaginal discharge that is bloody, brown, or rust Side effects that usually do not require medical attention (report to your doctor or health care professional if they continue or are bothersome): -hot flashes -joint pain -leg cramps -sweating -swelling of the ankles, feet, hands This list may not describe all possible side effects. Call your doctor for medical advice about side effects. You may report side effects to FDA at 1-800-FDA-1088. Where should I keep my medicine? Keep out of the reach of children. Store at room temperature between 15 and 30 degrees C (59 and 86 degrees F). Throw away any unused medicine after the expiration date. NOTE: This sheet is a summary. It may not cover all possible information. If you have questions about this medicine, talk to your doctor, pharmacist, or health care provider.  2018 Elsevier/Gold Standard (2016-12-10 17:15:34)  

## 2018-07-05 NOTE — Progress Notes (Signed)
  History of Present Illness:  Becky Marshall is a 67 y.o. with recent DEXA showing progression to osteoporosis. She has not had a fracture. She has been on Calcium and Vit D for last several years.  No FH.  Not on steroids.  Prior DEXA was 2009 that I have access to, which showed osteopenia.  T-Scores are now < -2.5 for spine and hip.  PMHx: She  has a past medical history of Arthritis, Diverticulitis, GERD (gastroesophageal reflux disease), Irregular heart beat, and Pre-diabetes. Also,  has a past surgical history that includes Dilation and curettage of uterus; Hallux valgus lapidus (Right, 08/20/2017); Weil osteotomy (Right, 08/20/2017); and Capsulotomy metatarsophalangeal (Right, 08/20/2017)., family history includes Breast cancer in her paternal aunt.,  reports that she has never smoked. She has never used smokeless tobacco. She reports that she drinks about 5.0 standard drinks of alcohol per week. She reports that she does not use drugs. No outpatient medications have been marked as taking for the 07/05/18 encounter (Office Visit) with Nadara MustardHarris, Latarra Eagleton P, MD.  . Also, is allergic to hydrocodone..  Review of Systems  All other systems reviewed and are negative.   Physical Exam:  BP 120/80   Ht 5\' 2"  (1.575 m)   Wt 162 lb (73.5 kg)   BMI 29.63 kg/m  Body mass index is 29.63 kg/m. Constitutional: Well nourished, well developed female in no acute distress.  Abdomen: diffusely non tender to palpation, non distended, and no masses, hernias Neuro: Grossly intact Psych:  Normal mood and affect.    Assessment:   Age-related osteoporosis without current pathological fracture    -  Primary   Relevant Medications   raloxifene (EVISTA) 60 MG tablet     Medication treatment recommended.  Plan: She will undergo begin Evista in her medical therapy.    Pros and cons of Evista, as well as alternatives of ERT or Bisphosphonate therapy, discussed.  Info provided. F/U DEXA 2 years. She was amenable to  this plan and we will see her back for annual/PRN.  A total of 15 minutes were spent face-to-face with the patient during this encounter and over half of that time dealt with counseling and coordination of care.  Annamarie MajorPaul Drianna Chandran, MD, [facility]inda FrederickFACOG [facility], [facility] 07/05/2018  10:45 AM

## 2018-07-09 ENCOUNTER — Telehealth: Payer: Self-pay

## 2018-07-09 ENCOUNTER — Other Ambulatory Visit: Payer: Self-pay | Admitting: Obstetrics & Gynecology

## 2018-07-09 DIAGNOSIS — R140 Abdominal distension (gaseous): Secondary | ICD-10-CM

## 2018-07-09 MED ORDER — RALOXIFENE HCL 60 MG PO TABS: 60.0000 mg | ORAL_TABLET | Freq: Every day | ORAL | 11 refills | Status: DC

## 2018-07-09 NOTE — Telephone Encounter (Signed)
Pt calling to see what PH's thoughts are about calcitonin salmon vs. Raloxifene.  [phone]

## 2018-07-09 NOTE — Progress Notes (Signed)
Pt wanted to discuss OP treatments, and we discussed the lower likely effectiveness of Calcitonin to Evista, HRT, and Bisphosphanates.  She will start w Evista.  Also she c/o continued bloating and occas lower abd cramping.  Will arrange US as previously discussed.  Annamarie MajorPaul Blakeley Scheier, MD, Merlinda FrederickFACOG [facility], [facility] 07/09/2018  2:52 PM

## 2018-07-09 NOTE — Telephone Encounter (Signed)
Please schedule for GYN US and PH f/u visit; pt aware and awaiting call.  Order is in.

## 2018-07-09 NOTE — Telephone Encounter (Signed)
Pt called back ata 3:26.  [facility] been [facility] and would like for us to send just the reloxifene rx to [facility] instead.  [phone]  Pt aware rx sent to [facility].

## 2018-07-12 NOTE — Telephone Encounter (Signed)
Patient is schedule 07/21/18 °

## 2018-07-21 ENCOUNTER — Encounter: Payer: Self-pay | Admitting: Obstetrics & Gynecology

## 2018-07-21 ENCOUNTER — Ambulatory Visit: Payer: Medicare HMO | Admitting: Obstetrics & Gynecology

## 2018-07-21 ENCOUNTER — Ambulatory Visit (INDEPENDENT_AMBULATORY_CARE_PROVIDER_SITE_OTHER): Payer: Medicare HMO

## 2018-07-21 VITALS — BP 120/80 | Ht 62.0 in | Wt 163.0 lb

## 2018-07-21 DIAGNOSIS — R140 Abdominal distension (gaseous): Secondary | ICD-10-CM

## 2018-07-21 DIAGNOSIS — R1030 Lower abdominal pain, unspecified: Secondary | ICD-10-CM | POA: Diagnosis not present

## 2018-07-21 NOTE — Patient Instructions (Signed)
[facility] - Gastroenterology (GI) Care - [place] ... [email or web address] > practice-locations > profile > a...   Rating: 4.3 - ?43 [facility] - [place]. [address]. [place], [place] [address]. [phone]. Locate on Map ...  Midge Minium, MD, MA, [facility] - Gastroenterology - [place] and ... [email or web address] > find-a-provider > profile > dar...   Rating: 4.5 - ?58 votesDr. Midge Minium specializes in gastroenterology in [place] and [place] ... for Gastrointestinal Endoscopy  [employer] ...  You've visited this page 3 times. Last visit: 07/20/18  Gastroenterology - [facility] [email or web address] > specialties > gastroenterology   The purpose of the Gastroenterology Department of the [facility] is to improve the health of our patients. We are dedicated to the early detection of prompt .Marland KitchenMarland Kitchen

## 2018-07-21 NOTE — Progress Notes (Signed)
  HPI: lower abdominal pain and bloating, for several mos.  No radiation, assoc sx's, modifiers, pain is mild-mod.  Ultrasound demonstrates no masses seen, no ovarian cysts or masses, no free fluid These findings are Pelvis normal  PMHx: She  has a past medical history of Arthritis, Diverticulitis, GERD (gastroesophageal reflux disease), Irregular heart beat, Osteoporosis, and Pre-diabetes. Also,  has a past surgical history that includes Dilation and curettage of uterus; Hallux valgus lapidus (Right, 08/20/2017); Weil osteotomy (Right, 08/20/2017); and Capsulotomy metatarsophalangeal (Right, 08/20/2017)., family history includes Breast cancer in her paternal aunt.,  reports that she has never smoked. She has never used smokeless tobacco. She reports that she drinks about 5.0 standard drinks of alcohol per week. She reports that she does not use drugs.  She has a current medication list which includes the following prescription(s): acetaminophen, aspirin ec, atenolol, calcium carb-cholecalciferol, cetirizine, chlorpheniramine, b-12, cranberry, enoxaparin, flaxseed oil, fluticasone, loperamide, magnesium, melatonin, metronidazole, omega-3 acid ethyl esters, omega-3 fish oil, omeprazole, prenat-fecbn-febisg-fa-fishoil, probiotic product, probiotic product, pyridoxine, raloxifene, raloxifene, and ranitidine. Also, is allergic to hydrocodone.  Review of Systems  All other systems reviewed and are negative.  Objective: BP 120/80   Ht 5\' 2"  (1.575 m)   Wt 163 lb (73.9 kg)   BMI 29.81 kg/m   Physical examination Constitutional NAD, Conversant  Skin No rashes, lesions or ulceration.   Extremities: Moves all appropriately.  Normal ROM for age. No lymphadenopathy.  Neuro: Grossly intact  Psych: Oriented to PPT.  Normal mood. Normal affect.   Assessment:  Lower abdominal pain  Bloating  GI referral if decides to look into this further  A total of 15 minutes were spent face-to-face with the  patient during this encounter and over half of that time dealt with counseling and coordination of care.  Annamarie Major, MD, [facility], [facility] 07/21/2018  11:09 AM

## 2018-10-06 ENCOUNTER — Telehealth: Payer: Self-pay

## 2018-10-06 NOTE — Telephone Encounter (Signed)
Pt calling requesting a different medication other than Evista be sent in to pharmacy. States she is having cramping side effects in legs. RPH pt

## 2018-10-07 ENCOUNTER — Other Ambulatory Visit: Payer: Self-pay | Admitting: Obstetrics & Gynecology

## 2018-10-07 MED ORDER — IBANDRONATE SODIUM 150 MG PO TABS: 150.0000 mg | ORAL_TABLET | ORAL | 3 refills | Status: DC

## 2018-10-07 NOTE — Telephone Encounter (Signed)
Pt aware.

## 2018-10-07 NOTE — Telephone Encounter (Signed)
ERx Boniva once a month called in.  Start right away and be consistant with monthly dosing and follow the instructions (take in morning on empty stomach and do not eat or lie down or take any other meds for 30 min)

## 2019-05-30 ENCOUNTER — Other Ambulatory Visit: Payer: Self-pay | Admitting: Obstetrics & Gynecology

## 2019-05-30 DIAGNOSIS — Z1231 Encounter for screening mammogram for malignant neoplasm of breast: Secondary | ICD-10-CM

## 2019-06-24 ENCOUNTER — Encounter: Payer: Self-pay | Admitting: Obstetrics & Gynecology

## 2019-06-24 ENCOUNTER — Other Ambulatory Visit: Payer: Self-pay

## 2019-06-24 ENCOUNTER — Ambulatory Visit (INDEPENDENT_AMBULATORY_CARE_PROVIDER_SITE_OTHER): Payer: Medicare HMO | Admitting: Obstetrics & Gynecology

## 2019-06-24 VITALS — BP 130/80 | Ht 61.0 in | Wt 163.0 lb

## 2019-06-24 DIAGNOSIS — M810 Age-related osteoporosis without current pathological fracture: Secondary | ICD-10-CM

## 2019-06-24 DIAGNOSIS — Z01419 Encounter for gynecological examination (general) (routine) without abnormal findings: Secondary | ICD-10-CM

## 2019-06-24 DIAGNOSIS — Z1211 Encounter for screening for malignant neoplasm of colon: Secondary | ICD-10-CM

## 2019-06-24 MED ORDER — IBANDRONATE SODIUM 150 MG PO TABS: 150.0000 mg | ORAL_TABLET | ORAL | 3 refills | Status: DC

## 2019-06-24 NOTE — Patient Instructions (Addendum)
PAP every three years Mammogram every year, scheduled     Call [phone] to change schedule at [facility] if needed Colonoscopy every 10 years, soon Labs yearly (with PCP)   Osteoporosis  Osteoporosis is thinning and loss of density in your bones. Osteoporosis makes bones more brittle and fragile and more likely to break (fracture). Over time, osteoporosis can cause your bones to become so weak that they fracture after a minor fall. Bones in the hip, wrist, and spine are most likely to fracture due to osteoporosis. What are the causes? The exact cause of this condition is not known. What increases the risk? You may be at greater risk for osteoporosis if you:  Have a family history of the condition.  Have poor nutrition.  Use steroid medicines, such as prednisone.  Are female.  Are age 55 or older.  Smoke or have a history of smoking.  Are not physically active (are sedentary).  Are white (Caucasian) or of Asian descent.  Have a small body frame.  Take certain medicines, such as antiseizure medicines. What are the signs or symptoms? A fracture might be the first sign of osteoporosis, especially if the fracture results from a fall or injury that usually would not cause a bone to break. Other signs and symptoms include:  Pain in the neck or low back.  Stooped posture.  Loss of height. How is this diagnosed? This condition may be diagnosed based on:  Your medical history.  A physical exam.  A bone mineral density test, also called a DXA or DEXA test (dual-energy X-ray absorptiometry test). This test uses X-rays to measure the amount of minerals in your bones. How is this treated? The goal of treatment is to strengthen your bones and lower your risk for a fracture. Treatment may involve:  Making lifestyle changes, such as: ? Including foods with more calcium and vitamin D in your diet. ? Doing weight-bearing and muscle-strengthening exercises. ? Stopping tobacco  use. ? Limiting alcohol intake.  Taking medicine to slow the process of bone loss or to increase bone density.  Taking daily supplements of calcium and vitamin D.  Taking hormone replacement medicines, such as estrogen for women and testosterone for men.  Monitoring your levels of calcium and vitamin D. Follow these instructions at home:  Activity  Exercise as told by your health care provider. Ask your health care provider what exercises and activities are safe for you. You should do: ? Exercises that make you work against gravity (weight-bearing exercises), such as tai chi, yoga, or walking. ? Exercises to strengthen muscles, such as lifting weights. Lifestyle  Limit alcohol intake to no more than 1 drink a day for nonpregnant women and 2 drinks a day for men. One drink equals 12 oz of beer, 5 oz of wine, or 1 oz of hard liquor.  Do not use any products that contain nicotine or tobacco, such as cigarettes and e-cigarettes. If you need help quitting, ask your health care provider. Preventing falls  Use devices to help you move around (mobility aids) as needed, such as canes, walkers, scooters, or crutches.  Keep rooms well-lit and clutter-free.  Remove tripping hazards from walkways, including cords and throw rugs.  Install grab bars in bathrooms and safety rails on stairs.  Use rubber mats in the bathroom and other areas that are often wet or slippery.  Wear closed-toe shoes that fit well and support your feet. Wear shoes that have rubber soles or low heels.  Review your  medicines with your health care provider. Some medicines can cause dizziness or changes in blood pressure, which can increase your risk of falling. General instructions  Include calcium and vitamin D in your diet. Calcium is important for bone health, and vitamin D helps your body to absorb calcium. Good sources of calcium and vitamin D include: ? Certain fatty fish, such as salmon and tuna. ? Products  that have calcium and vitamin D added to them (fortified products), such as fortified cereals. ? Egg yolks. ? Cheese. ? Liver.  Take over-the-counter and prescription medicines only as told by your health care provider.  Keep all follow-up visits as told by your health care provider. This is important. Contact a health care provider if:  You have never been screened for osteoporosis and you are: ? A woman who is age 33 or older. ? A man who is age 64 or older. Get help right away if:  You fall or injure yourself. Summary  Osteoporosis is thinning and loss of density in your bones. This makes bones more brittle and fragile and more likely to break (fracture),even with minor falls.  The goal of treatment is to strengthen your bones and reduce your risk for a fracture.  Include calcium and vitamin D in your diet. Calcium is important for bone health, and vitamin D helps your body to absorb calcium.  Talk with your health care provider about screening for osteoporosis if you are a woman who is age 80 or older, or a man who is age 28 or older. This information is not intended to replace advice given to you by your health care provider. Make sure you discuss any questions you have with your health care provider. Document Released: 08/13/2005 Document Revised: 10/16/2017 Document Reviewed: 08/28/2017 Elsevier Patient Education  2020 [employer].

## 2019-06-24 NOTE — Progress Notes (Signed)
HPI:      Ms. Becky Marshall is a 68 y.o. G2P2002 who LMP was in the past, she presents today for her annual examination.  The patient has no complaints today. The patient is not currently sexually active. Herlast pap: approximate date 2018 and was normal and last mammogram: approximate date 2019 and was normal.  The patient does perform self breast exams.  There is no notable family history of breast or ovarian cancer in her family. The patient is not taking hormone replacement therapy. Patient denies post-menopausal vaginal bleeding.   The patient has regular exercise: yes. The patient denies current symptoms of depression.    GYN Hx: Last Colonoscopy:10 years ago. Normal.  Last DEXA: year ago.      Did not tolerate Evista    Has taken Boniva until July, held to see if it is causing side effect of joint pains (diffuse)  PMHx: Past Medical History:  Diagnosis Date  . Arthritis   . Diverticulitis   . GERD (gastroesophageal reflux disease)   . Irregular heart beat   . Osteoporosis   . Pre-diabetes    Past Surgical History:  Procedure Laterality Date  . CAPSULOTOMY METATARSOPHALANGEAL Right 08/20/2017   Procedure: CAPSULOTOMY METATARSOPHALANGEAL-2ND & 3RD;  Surgeon: Recardo Evangelistroxler, Matthew, DPM;  [facility];  Service: Podiatry;  Laterality: Right;  . DILATION AND CURETTAGE OF UTERUS    . HALLUX VALGUS LAPIDUS Right 08/20/2017   Procedure: HALLUX VALGUS LAPIDUS;  Surgeon: Recardo Evangelistroxler, Matthew, DPM;  [facility];  Service: Podiatry;  Laterality: Right;  . WEIL OSTEOTOMY Right 08/20/2017   Procedure: WEIL OSTEOTOMY-2ND & 3RD TOES;  Surgeon: Recardo Evangelistroxler, Matthew, DPM;  [facility];  Service: Podiatry;  Laterality: Right;   Family History  Problem Relation Age of Onset  . Breast cancer Paternal Aunt    Social History   Tobacco Use  . Smoking status: Never Smoker  . Smokeless tobacco: Never Used  Substance Use Topics  . Alcohol use: Yes   Alcohol/week: 5.0 standard drinks    Types: 5 Cans of beer per week  . Drug use: No    Current Outpatient Medications:  .  Acetaminophen (TYLENOL ARTHRITIS PAIN PO), Take 650 mg by mouth., Disp: , Rfl:  .  aspirin EC 81 MG tablet, Take by mouth., Disp: , Rfl:  .  atenolol (TENORMIN) 25 MG tablet, , Disp: , Rfl:  .  Calcium Carb-Cholecalciferol (CALCIUM 1000 + D PO), Take 750 mg by mouth. , Disp: , Rfl:  .  cetirizine (ZYRTEC) 10 MG tablet, Take by mouth., Disp: , Rfl:  .  chlorpheniramine (CHLOR-TRIMETON) 4 MG tablet, Take 4 mg by mouth every 4 (four) hours as needed for allergies., Disp: , Rfl:  .  Cobalamine Combinations (B-12) 1000-400 MCG SUBL, Place under the tongue., Disp: , Rfl:  .  Cranberry 500 MG CAPS, Take 500 mg by mouth., Disp: , Rfl:  .  enoxaparin (LOVENOX) 40 MG/0.4ML injection, Inject 0.4 mLs (40 mg total) into the skin daily., Disp: 5 Syringe, Rfl: 0 .  Flaxseed, Linseed, (FLAXSEED OIL) 1000 MG CAPS, Take by mouth., Disp: , Rfl:  .  fluticasone (FLONASE) 50 MCG/ACT nasal spray, Place 1 spray into both nostrils daily., Disp: , Rfl:  .  ibandronate (BONIVA) 150 MG tablet, Take 1 tablet (150 mg total) by mouth every 30 (thirty) days. Take in the morning and do not take anything else po for the next 30 min., Disp: 4 tablet, Rfl: 3 .  loperamide (IMODIUM A-D) 2 MG tablet, Take 2 mg by mouth. , Disp: , Rfl:  .  Magnesium 250 MG TABS, Take by mouth., Disp: , Rfl:  .  Melatonin 5 MG CAPS, Take by mouth., Disp: , Rfl:  .  metroNIDAZOLE (METROGEL) 0.75 % gel, USE 1 APPLICATION (THIN LAYER) TO FACE TWICE A DAY, Disp: , Rfl: 3 .  omega-3 acid ethyl esters (LOVAZA) 1 g capsule, Take by mouth., Disp: , Rfl:  .  omega-3 fish oil (MAXEPA) 1000 MG CAPS capsule, Take 1 capsule by mouth 2 (two) times daily., Disp: , Rfl:  .  PRENAT-FECBN-FEBISG-FA-FISHOIL PO, Take by mouth., Disp: , Rfl:  .  Probiotic Product (PROBIOTIC-10 PO), Take by mouth., Disp: , Rfl:  .  Probiotic Product (PROBIOTIC-10  PO), Take by mouth., Disp: , Rfl:  .  pyridOXINE (VITAMIN B-6) 25 MG tablet, Take by mouth., Disp: , Rfl:  .  ranitidine (ZANTAC) 150 MG capsule, Take 150 mg by mouth 2 (two) times daily., Disp: , Rfl:  .  omeprazole (PRILOSEC) 20 MG capsule, Take by mouth., Disp: , Rfl:  Allergies: Hydrocodone  Review of Systems  Constitutional: Negative for chills, fever and malaise/fatigue.  HENT: Negative for congestion, sinus pain and sore throat.   Eyes: Negative for blurred vision and pain.  Respiratory: Negative for cough and wheezing.   Cardiovascular: Negative for chest pain and leg swelling.  Gastrointestinal: Negative for abdominal pain, constipation, diarrhea, heartburn, nausea and vomiting.  Genitourinary: Negative for dysuria, frequency, hematuria and urgency.  Musculoskeletal: Negative for back pain, joint pain, myalgias and neck pain.  Skin: Negative for itching and rash.  Neurological: Negative for dizziness, tremors and weakness.  Endo/Heme/Allergies: Does not bruise/bleed easily.  Psychiatric/Behavioral: Negative for depression. The patient is not nervous/anxious and does not have insomnia.     Objective: BP 130/80   Ht 5\' 1"  (1.549 m)   Wt 163 lb (73.9 kg)   BMI 30.80 kg/m   Filed Weights   06/24/19 1327  Weight: 163 lb (73.9 kg)   Body mass index is 30.8 kg/m. Physical Exam Constitutional:      General: She is not in acute distress.    Appearance: She is well-developed.  Genitourinary:     Pelvic exam was performed with patient supine.     Vagina, uterus and rectum normal.     No lesions in the vagina.     No vaginal bleeding.     No cervical motion tenderness, friability, lesion or polyp.     Uterus is mobile.     Uterus is not enlarged.     No uterine mass detected.    Uterus is midaxial.     No right or left adnexal mass present.     Right adnexa not tender.     Left adnexa not tender.  HENT:     Head: Normocephalic and atraumatic. No laceration.     Right  Ear: Hearing normal.     Left Ear: Hearing normal.     Mouth/Throat:     Pharynx: Uvula midline.  Eyes:     Pupils: Pupils are equal, round, and reactive to light.  Neck:     Musculoskeletal: Normal range of motion and neck supple.     Thyroid: No thyromegaly.  Cardiovascular:     Rate and Rhythm: Normal rate and regular rhythm.     Heart sounds: No murmur. No friction rub. No gallop.   Pulmonary:     Effort: Pulmonary effort is  normal. No respiratory distress.     Breath sounds: Normal breath sounds. No wheezing.  Chest:     Breasts:        Right: No mass, skin change or tenderness.        Left: No mass, skin change or tenderness.  Abdominal:     General: Bowel sounds are normal. There is no distension.     Palpations: Abdomen is soft.     Tenderness: There is no abdominal tenderness. There is no rebound.  Musculoskeletal: Normal range of motion.  Neurological:     Mental Status: She is alert and oriented to person, place, and time.     Cranial Nerves: No cranial nerve deficit.  Skin:    General: Skin is warm and dry.  Psychiatric:        Judgment: Judgment normal.  Vitals signs reviewed.     Assessment: Annual Exam 1. Women's annual routine gynecological examination   2. Age-related osteoporosis without current pathological fracture   3. Screen for colon cancer     Plan:            1.  Cervical Screening-  Pap smear schedule reviewed with patient, Pap smear to be scheduled next year  2. Breast screening- Exam annually and mammogram scheduled  3. Colonoscopy every 10 years, Hemoccult testing after age 55  4. Labs managed by PCP  5. Counseling for hormonal therapy: none              6. FRAX - FRAX score for assessing the 10 year probability for fracture calculated and discussed today.  Based on age and score today, DEXA is not scheduled.  Due again next year  Plan to hold Boniva this month then restart if sx's have no change (joint pains).  If pains do improve  and thus possibly linked to [facility], then will refer to endocrinology for infusion or alternative osteoporosis intervention.    F/U  Return in about 1 year (around 06/23/2020) for Annual.  Barnett Applebaum, MD, [facility], [facility] 06/24/2019  1:53 PM

## 2019-06-24 NOTE — Addendum Note (Signed)
Addended by: Gae Dry on: 06/24/2019 01:58 PM   Modules accepted: Orders

## 2019-07-08 ENCOUNTER — Ambulatory Visit
Admission: RE | Admit: 2019-07-08 | Discharge: 2019-07-08 | Disposition: A | Discharge status: Home / Self Care | Payer: Medicare HMO | Source: Ambulatory Visit | Attending: Obstetrics & Gynecology | Admitting: Obstetrics & Gynecology

## 2019-07-08 ENCOUNTER — Encounter: Payer: Self-pay | Admitting: Obstetrics & Gynecology

## 2019-07-08 DIAGNOSIS — Z1231 Encounter for screening mammogram for malignant neoplasm of breast: Secondary | ICD-10-CM | POA: Diagnosis present

## 2020-02-01 ENCOUNTER — Other Ambulatory Visit: Payer: Self-pay | Admitting: Dermatology

## 2020-07-10 ENCOUNTER — Telehealth: Payer: Self-pay

## 2020-07-10 NOTE — Telephone Encounter (Signed)
Pt calling; ?UTI, ?kidney issues; can PH order kidney scan or is that out of his scope of practice?; can he ref pt to [facility]?  They can see her next week.  [phone]  Adv pt PH can order scan and then ref if necessary.  Pt has appt c PH on Fri.

## 2020-07-13 ENCOUNTER — Other Ambulatory Visit (HOSPITAL_COMMUNITY)
Admission: RE | Admit: 2020-07-13 | Discharge: 2020-07-13 | Disposition: A | Discharge status: Home / Self Care | Payer: Medicare HMO | Source: Ambulatory Visit | Attending: Obstetrics & Gynecology | Admitting: Obstetrics & Gynecology

## 2020-07-13 ENCOUNTER — Other Ambulatory Visit: Payer: Self-pay

## 2020-07-13 ENCOUNTER — Encounter: Payer: Self-pay | Admitting: Obstetrics & Gynecology

## 2020-07-13 ENCOUNTER — Ambulatory Visit (INDEPENDENT_AMBULATORY_CARE_PROVIDER_SITE_OTHER): Payer: Medicare HMO | Admitting: Obstetrics & Gynecology

## 2020-07-13 ENCOUNTER — Telehealth: Payer: Self-pay | Admitting: Obstetrics & Gynecology

## 2020-07-13 VITALS — BP 130/80 | Ht 61.5 in | Wt 148.0 lb

## 2020-07-13 DIAGNOSIS — Z01419 Encounter for gynecological examination (general) (routine) without abnormal findings: Secondary | ICD-10-CM | POA: Insufficient documentation

## 2020-07-13 DIAGNOSIS — Z1151 Encounter for screening for human papillomavirus (HPV): Secondary | ICD-10-CM | POA: Diagnosis not present

## 2020-07-13 DIAGNOSIS — R140 Abdominal distension (gaseous): Secondary | ICD-10-CM

## 2020-07-13 DIAGNOSIS — Z1231 Encounter for screening mammogram for malignant neoplasm of breast: Secondary | ICD-10-CM

## 2020-07-13 DIAGNOSIS — R1030 Lower abdominal pain, unspecified: Secondary | ICD-10-CM | POA: Diagnosis not present

## 2020-07-13 DIAGNOSIS — M810 Age-related osteoporosis without current pathological fracture: Secondary | ICD-10-CM | POA: Diagnosis not present

## 2020-07-13 DIAGNOSIS — Z124 Encounter for screening for malignant neoplasm of cervix: Secondary | ICD-10-CM

## 2020-07-13 MED ORDER — IBANDRONATE SODIUM 150 MG PO TABS: 150.0000 mg | ORAL_TABLET | ORAL | 3 refills | Status: DC

## 2020-07-13 NOTE — Telephone Encounter (Signed)
Patient is requesting the Rx for the generic boniva? be sent to [facility] and not [facility].

## 2020-07-13 NOTE — Progress Notes (Signed)
HPI:      Ms. Becky Marshall is a 69 y.o. G2P2002 who LMP was in the past, she presents today for her annual examination.  The patient has new complaints today of  Bloating w RLQ and flank pains intermittently but over the past 12 mos.  She has lost (intentionally0) 17 lbs.  Recent upper GI normal.  Has colonoscopy planned in Oct.  Also reports 3 UTI this year.  The patient is not currently sexually active. Widowed. Herlast pap: approximate date 2018 and was normal and last mammogram: approximate date 2020 and was normal.  The patient does perform self breast exams.  There is no notable family history of breast or ovarian cancer in her family. The patient is not taking hormone replacement therapy. Patient denies post-menopausal vaginal bleeding.   The patient has regular exercise: yes. The patient denies current symptoms of depression.    GYN Hx: Last Colonoscopy:5 years ago. Normal.  Last DEXA: 2 years ago.    PMHx: Past Medical History:  Diagnosis Date   Arthritis    Diverticulitis    GERD (gastroesophageal reflux disease)    Hypothyroid    Irregular heart beat    Osteoporosis    Pre-diabetes    Past Surgical History:  Procedure Laterality Date   CAPSULOTOMY METATARSOPHALANGEAL Right 08/20/2017   Procedure: CAPSULOTOMY METATARSOPHALANGEAL-2ND & 3RD;  Surgeon: Recardo Evangelist, DPM;  [facility];  Service: Podiatry;  Laterality: Right;   DILATION AND CURETTAGE OF UTERUS     HALLUX VALGUS LAPIDUS Right 08/20/2017   Procedure: HALLUX VALGUS LAPIDUS;  Surgeon: Recardo Evangelist, DPM;  Location[facility];  Service: Podiatry;  Laterality: Right;   WEIL OSTEOTOMY Right 08/20/2017   Procedure: WEIL OSTEOTOMY-2ND & 3RD TOES;  Surgeon: Recardo Evangelist, DPM;  Location: [facility];  Service: Podiatry;  Laterality: Right;   Family History  Problem Relation Age of Onset   Breast cancer Paternal Aunt    Social History   Tobacco Use   Smoking  status: Never Smoker   Smokeless tobacco: Never Used  Vaping Use   Vaping Use: Never used  Substance Use Topics   Alcohol use: Yes    Alcohol/week: 5.0 standard drinks    Types: 5 Cans of beer per week   Drug use: No    Current Outpatient Medications:    Acetaminophen (TYLENOL ARTHRITIS PAIN PO), Take 650 mg by mouth., Disp: , Rfl:    aspirin EC 81 MG tablet, Take by mouth., Disp: , Rfl:    atenolol (TENORMIN) 25 MG tablet, , Disp: , Rfl:    Calcium Carb-Cholecalciferol (CALCIUM 1000 + D PO), Take 750 mg by mouth. , Disp: , Rfl:    cetirizine (ZYRTEC) 10 MG tablet, Take by mouth., Disp: , Rfl:    chlorpheniramine (CHLOR-TRIMETON) 4 MG tablet, Take 4 mg by mouth every 4 (four) hours as needed for allergies., Disp: , Rfl:    Cobalamine Combinations (B-12) 1000-400 MCG SUBL, Place under the tongue., Disp: , Rfl:    Cranberry 500 MG CAPS, Take 500 mg by mouth., Disp: , Rfl:    enoxaparin (LOVENOX) 40 MG/0.4ML injection, Inject 0.4 mLs (40 mg total) into the skin daily., Disp: 5 Syringe, Rfl: 0   Flaxseed, Linseed, (FLAXSEED OIL) 1000 MG CAPS, Take by mouth., Disp: , Rfl:    fluticasone (FLONASE) 50 MCG/ACT nasal spray, Place 1 spray into both nostrils daily., Disp: , Rfl:    ibandronate (BONIVA) 150 MG tablet, Take 1 tablet (150  mg total) by mouth every 30 (thirty) days. Take in the morning and do not take anything else po for the next 30 min., Disp: 4 tablet, Rfl: 3   loperamide (IMODIUM A-D) 2 MG tablet, Take 2 mg by mouth. , Disp: , Rfl:    Magnesium 250 MG TABS, Take by mouth., Disp: , Rfl:    Melatonin 5 MG CAPS, Take by mouth., Disp: , Rfl:    metroNIDAZOLE (METROGEL) 0.75 % gel, APPLY A THIN LAYER TO FACE TWICE A DAY, Disp: 45 g, Rfl: 3   omega-3 acid ethyl esters (LOVAZA) 1 g capsule, Take by mouth., Disp: , Rfl:    omega-3 fish oil (MAXEPA) 1000 MG CAPS capsule, Take 1 capsule by mouth 2 (two) times daily., Disp: , Rfl:    PRENAT-FECBN-FEBISG-FA-FISHOIL PO,  Take by mouth., Disp: , Rfl:    Probiotic Product (PROBIOTIC-10 PO), Take by mouth., Disp: , Rfl:    Probiotic Product (PROBIOTIC-10 PO), Take by mouth., Disp: , Rfl:    pyridOXINE (VITAMIN B-6) 25 MG tablet, Take by mouth., Disp: , Rfl:    ranitidine (ZANTAC) 150 MG capsule, Take 150 mg by mouth 2 (two) times daily., Disp: , Rfl:    levothyroxine (SYNTHROID) 25 MCG tablet, Take 25 mcg by mouth every morning., Disp: , Rfl:    omeprazole (PRILOSEC) 20 MG capsule, Take by mouth., Disp: , Rfl:  Allergies: Hydrocodone  Review of Systems  Constitutional: Positive for weight loss. Negative for chills, fever and malaise/fatigue.  HENT: Negative for congestion, sinus pain and sore throat.   Eyes: Negative for blurred vision and pain.  Respiratory: Negative for cough and wheezing.   Cardiovascular: Negative for chest pain and leg swelling.  Gastrointestinal: Positive for abdominal pain. Negative for constipation, diarrhea, heartburn, nausea and vomiting.  Genitourinary: Positive for dysuria, flank pain and frequency. Negative for hematuria and urgency.  Musculoskeletal: Negative for back pain, joint pain, myalgias and neck pain.  Skin: Negative for itching and rash.  Neurological: Negative for dizziness, tremors and weakness.  Endo/Heme/Allergies: Does not bruise/bleed easily.  Psychiatric/Behavioral: Negative for depression. The patient is not nervous/anxious and does not have insomnia.     Objective: BP 130/80    Ht 5' 1.5" (1.562 m)    Wt 148 lb (67.1 kg)    BMI 27.51 kg/m   Filed Weights   07/13/20 1321  Weight: 148 lb (67.1 kg)   Body mass index is 27.51 kg/m. Physical Exam Constitutional:      General: She is not in acute distress.    Appearance: She is well-developed.  Genitourinary:     Pelvic exam was performed with patient supine.     Urethra, bladder, vagina, uterus and rectum normal.     No lesions in the vagina.     No vaginal bleeding.     No cervical motion  tenderness, friability, lesion or polyp.     Uterus is mobile.     Uterus is not enlarged.     No uterine mass detected.    Uterus is midaxial.     No right or left adnexal mass present.     Right adnexa tender.     Left adnexa not tender.  HENT:     Head: Normocephalic and atraumatic. No laceration.     Right Ear: Hearing normal.     Left Ear: Hearing normal.     Mouth/Throat:     Pharynx: Uvula midline.  Eyes:     Pupils: Pupils are equal,  round, and reactive to light.  Neck:     Thyroid: No thyromegaly.  Cardiovascular:     Rate and Rhythm: Normal rate and regular rhythm.     Heart sounds: No murmur heard.  No friction rub. No gallop.   Pulmonary:     Effort: Pulmonary effort is normal. No respiratory distress.     Breath sounds: Normal breath sounds. No wheezing.  Chest:     Breasts:        Right: No mass, skin change or tenderness.        Left: No mass, skin change or tenderness.  Abdominal:     General: Bowel sounds are normal. There is no distension.     Palpations: Abdomen is soft.     Tenderness: There is abdominal tenderness in the right lower quadrant. There is no rebound.     Comments: Mild RLQ T  Musculoskeletal:        General: Normal range of motion.     Cervical back: Normal range of motion and neck supple.  Neurological:     Mental Status: She is alert and oriented to person, place, and time.     Cranial Nerves: No cranial nerve deficit.  Skin:    General: Skin is warm and dry.  Psychiatric:        Judgment: Judgment normal.  Vitals reviewed.     Assessment: Annual Exam 1. Women's annual routine gynecological examination   2. Age-related osteoporosis without current pathological fracture   3. Lower abdominal pain   4. Bloating   5. Encounter for screening mammogram for malignant neoplasm of breast     Plan:            1.  Cervical Screening-  Pap smear done today  2. Breast screening- Exam annually and mammogram scheduled  3. Colonoscopy  scheduled in OCT  4. Labs managed by PCP  5. Counseling for hormonal therapy: none              6. FRAX - FRAX score for assessing the 10 year probability for fracture calculated and discussed today.  Based on age and score today, DEXA is scheduled.   7. [place] for RLQ pain and bloating as first step, to rule out ovarian etiology.  Next would be abd [place].  Consider urology referral as well, esp if more UTIs occur.    F/U  Return in about 2 weeks (around 07/27/2020) for Follow up w GYN [place].  Annamarie Major, MD, [facility], [facility] 07/13/2020  1:55 PM

## 2020-07-13 NOTE — Patient Instructions (Signed)
PAP every three years Mammogram every year    Call [phone] to schedule at [facility] Bone scan too at [facility] Colonoscopy every 10 years - soon Labs yearly (with PCP)  Thank you for choosing [facility]. As part of our ongoing efforts to improve patient experience, we would appreciate your feedback. Please fill out the short survey that you will receive by mail or MyChart. Your opinion is important to [place]! - Dr. Tiburcio Pea   Transvaginal Ultrasound A transvaginal ultrasound, also called an endovaginal ultrasound, is a test that uses sound waves to take pictures of the female genital tract. The pictures are taken with a device, called a transducer, that is placed in the vagina. This test may be done to:  Check for problems with your pregnancy.  Check your developing baby.  Check for anything abnormal in the uterus or ovaries.  Find out why you have pelvic pain or bleeding. Tell a health care provider about:  Any allergies you have.  All medicines you are taking, including vitamins, herbs, eye drops, creams, and over-the-counter medicines.  Any blood disorders you have.  Any surgeries you have had.  Any medical conditions you have.  Whether you are pregnant or may be pregnant.  Whether you are having your menstrual period. What are the risks? This is a safe procedure. There are no known risks or complications of having this test. What happens before the procedure? This procedure needs to be done when your bladder is empty. Follow your health care provider's instructions about drinking fluids and emptying your bladder before the test. What happens during the procedure?   You will empty your bladder before the procedure.  You will undress from the waist down.  You will lie down on an exam table, with your knees bent and your feet in foot holders.  A health care provider will cover the transducer with a sterile cover.  A gel will be put on the transducer. The gel  helps transmit the sound waves and prevents irritation of your vagina.  The technician will insert the transducer into your vagina to get images. These will be displayed on a monitor that looks like a small television screen.  The transducer will be removed when the procedure is complete. The procedure may vary among health care providers and hospitals. What happens after the procedure?  It is up to you to get the results of your procedure. Ask your health care provider, or the department that is doing the procedure, when your results will be ready.  Keep all follow-up visits as told by your health care provider. This is important. Summary  A transvaginal ultrasound, also called an endovaginal ultrasound, is a test that uses sound waves to take pictures of the female genital tract.  This is a safe procedure. There are no known risks associated with this test.  The procedure needs to be done when your bladder is empty. Follow your health care provider's instructions about drinking fluids and emptying your bladder before the test.  During the procedure, you will undress from the waist down and lie down on an exam table. A technician will insert a transducer into your vagina to obtain images.  Ask your health care provider, or the department that is doing the procedure, when your results will be ready. This information is not intended to replace advice given to you by your health care provider. Make sure you discuss any questions you have with your health care provider. Document Revised: 06/16/2018 Document  Reviewed: 06/16/2018 Elsevier Patient Education  [employer].

## 2020-07-13 NOTE — Telephone Encounter (Signed)
Done

## 2020-07-13 NOTE — Addendum Note (Signed)
Addended by: Nadara Mustard on: 07/13/2020 03:36 PM   Modules accepted: Orders

## 2020-07-17 LAB — CYTOLOGY - PAP
Comment: NEGATIVE
Diagnosis: NEGATIVE
High risk HPV: NEGATIVE

## 2020-08-06 ENCOUNTER — Other Ambulatory Visit: Payer: Self-pay

## 2020-08-06 ENCOUNTER — Ambulatory Visit (INDEPENDENT_AMBULATORY_CARE_PROVIDER_SITE_OTHER): Payer: Medicare HMO

## 2020-08-06 ENCOUNTER — Ambulatory Visit (INDEPENDENT_AMBULATORY_CARE_PROVIDER_SITE_OTHER): Payer: Medicare HMO | Admitting: Obstetrics & Gynecology

## 2020-08-06 ENCOUNTER — Encounter: Payer: Self-pay | Admitting: Obstetrics & Gynecology

## 2020-08-06 VITALS — BP 120/80 | Ht 61.5 in | Wt 145.0 lb

## 2020-08-06 DIAGNOSIS — R140 Abdominal distension (gaseous): Secondary | ICD-10-CM

## 2020-08-06 DIAGNOSIS — R1030 Lower abdominal pain, unspecified: Secondary | ICD-10-CM

## 2020-08-06 NOTE — Progress Notes (Signed)
  HPI: Pt has been having intermittent bloating and pains.  Has colonoscopy scheduled soon w GI work up as well.  Ultrasound demonstrates no masses seen, cyst seen and seem small like follicles, see below  PMHx: She  has a past medical history of Arthritis, Diverticulitis, GERD (gastroesophageal reflux disease), Hypothyroid, Irregular heart beat, Osteoporosis, and Pre-diabetes. Also,  has a past surgical history that includes Dilation and curettage of uterus; Hallux valgus lapidus (Right, 08/20/2017); Weil osteotomy (Right, 08/20/2017); and Capsulotomy metatarsophalangeal (Right, 08/20/2017)., family history includes Breast cancer in her paternal aunt.,  reports that she has never smoked. She has never used smokeless tobacco. She reports current alcohol use of about 5.0 standard drinks of alcohol per week. She reports that she does not use drugs.  She has a current medication list which includes the following prescription(s): acetaminophen, aspirin ec, atenolol, calcium carb-cholecalciferol, cetirizine, chlorpheniramine, b-12, cranberry, enoxaparin, flaxseed oil, fluticasone, ibandronate, levothyroxine, loperamide, magnesium, melatonin, metronidazole, omega-3 acid ethyl esters, omega-3 fish oil, prenat-fecbn-febisg-fa-fishoil, probiotic product, probiotic product, pyridoxine, ranitidine, and omeprazole. Also, is allergic to hydrocodone.  Review of Systems  All other systems reviewed and are negative.   Objective: BP 120/80   Ht 5' 1.5" (1.562 m)   Wt 145 lb (65.8 kg)   BMI 26.95 kg/m   Physical examination Constitutional NAD, Conversant  Skin No rashes, lesions or ulceration.   Extremities: Moves all appropriately.  Normal ROM for age. No lymphadenopathy.  Neuro: Grossly intact  Psych: Oriented to PPT.  Normal mood. Normal affect.   US PELVIC COMPLETE WITH TRANSVAGINAL  Result Date: 08/06/2020 Patient Name: Becky Marshall DOB: 1951-08-23 MRN: [id number] ULTRASOUND REPORT Location: [facility] Date of Service: 08/06/2020 Indications:Pelvic Pain Findings: The uterus is anteverted and measures 5.5 x 3.9 x 3.0 cm. Echo texture is homogenous without evidence of focal masses. The Endometrium measures 1.4 mm. Right Ovary measures 2.3 x 1.8 x 1.6 cm. It is normal in appearance. There are three simple follicles seen in the right ovary measuring 7.6 x 5.5 x 8.7 mm, 6.2 x 5.6 x 7.2 mm and 6.6 x 6.1 x 6.2 mm. The Left Ovary is not visible. Survey of the adnexa demonstrates no adnexal masses. There is no free fluid in the cul de sac. Impression: 1. Normal appearing uterus and cervix. 2. Thin endometrium. 3. There are 3 follicles seen in the right ovary. 4. The left ovary is not visible. Recommendations: 1.Clinical correlation with the patient's History and Physical Exam. Deanna Artis, RT Review of ULTRASOUND.    I have personally reviewed images and report of recent ultrasound done at [facility].    Plan of management to be discussed with patient. Annamarie Major, MD, FACOG [facility], [facility] 08/06/2020  5:13 PM   Assessment:  Bloating Lower abdominal pain  Plan CA125 due to cystic ovary, although small and low risk for cancer GI work up as bloating and pains may be more GI related Considering urology as well  A total of 20 minutes were spent face-to-face with the patient as well as preparation, review, communication, and documentation during this encounter.   Annamarie Major, MD, [facility], [facility] 08/06/2020  5:14 PM

## 2020-08-08 ENCOUNTER — Other Ambulatory Visit: Payer: Self-pay

## 2020-08-08 ENCOUNTER — Other Ambulatory Visit: Payer: Medicare HMO

## 2020-08-08 DIAGNOSIS — R1030 Lower abdominal pain, unspecified: Secondary | ICD-10-CM

## 2020-08-08 DIAGNOSIS — R140 Abdominal distension (gaseous): Secondary | ICD-10-CM

## 2020-08-09 ENCOUNTER — Telehealth: Payer: Self-pay | Admitting: Obstetrics & Gynecology

## 2020-08-09 LAB — CA 125: Cancer Antigen (CA) 125: 14.9 U/mL (ref 0.0–38.1)

## 2020-08-09 NOTE — Telephone Encounter (Signed)
Pt aware.

## 2020-08-09 NOTE — Telephone Encounter (Signed)
Pt calling; wanted to ask if blood test was targated for ovarian cancer or for anywhere in the body?  [phone]

## 2020-08-09 NOTE — Progress Notes (Signed)
Sch GYN [place] and appt PH 3 mos  Discussed w pt results of [place] and CA125 level (all we have are 3 very small cysts on one ovary).  Risk of cancer low.  Will f/u GI for bloating  and pain sx's.  Repeat scan 3 mos.

## 2020-08-09 NOTE — Telephone Encounter (Signed)
-----   Message from Nadara Mustard, MD sent at 08/09/2020  7:43 AM EDT ----- [facility] [place] and appt PH 3 mos  Discussed w pt results of [place] and CA125 level (all we have are 3 very small cysts on one ovary).  Risk of cancer low.  Will f/u GI for bloating  and pain sx's.  Repeat scan 3 mos.

## 2020-08-09 NOTE — Telephone Encounter (Signed)
Called and left voicemail for patient to call back to be scheduled. 

## 2020-08-16 ENCOUNTER — Other Ambulatory Visit: Payer: Self-pay

## 2020-08-16 ENCOUNTER — Ambulatory Visit
Admission: RE | Admit: 2020-08-16 | Discharge: 2020-08-16 | Disposition: A | Discharge status: Home / Self Care | Payer: Medicare HMO | Source: Ambulatory Visit | Attending: Obstetrics & Gynecology | Admitting: Obstetrics & Gynecology

## 2020-08-16 ENCOUNTER — Other Ambulatory Visit: Payer: Self-pay | Admitting: Obstetrics & Gynecology

## 2020-08-16 DIAGNOSIS — Z1231 Encounter for screening mammogram for malignant neoplasm of breast: Secondary | ICD-10-CM | POA: Insufficient documentation

## 2020-08-16 DIAGNOSIS — M810 Age-related osteoporosis without current pathological fracture: Secondary | ICD-10-CM

## 2020-08-20 ENCOUNTER — Encounter: Payer: Self-pay | Admitting: Obstetrics & Gynecology

## 2020-08-27 ENCOUNTER — Other Ambulatory Visit: Payer: Self-pay | Admitting: Obstetrics & Gynecology

## 2020-08-27 ENCOUNTER — Telehealth: Payer: Self-pay

## 2020-08-27 NOTE — Telephone Encounter (Signed)
Has this been ordered by me? If so, follow up w patient regarding referral to endocrinology for osteoporosis

## 2020-08-27 NOTE — Telephone Encounter (Signed)
Pt calling to check on status of referral to bone specialist. Please advise. Was referral sent? She has not heard anything yet

## 2020-08-29 ENCOUNTER — Telehealth: Payer: Self-pay

## 2020-08-29 NOTE — Telephone Encounter (Signed)
Pt calling; wants to know if she should have her generic of boneva refilled; has appt c Endocrinologist for osteoporosis 11/12th; will be off med for a week or two before appt; doesn't want to pay for rx and then the Endocrinologist change rx.  Also, she is out of calcium; any calcium or calcium blend PH recommends?  [phone]

## 2020-08-30 NOTE — Telephone Encounter (Signed)
Pt aware.

## 2020-09-07 ENCOUNTER — Other Ambulatory Visit: Payer: Self-pay | Admitting: Obstetrics & Gynecology

## 2020-09-28 ENCOUNTER — Other Ambulatory Visit: Payer: Self-pay

## 2020-09-28 ENCOUNTER — Ambulatory Visit: Payer: Medicare HMO | Admitting: Endocrinology

## 2020-09-28 ENCOUNTER — Encounter: Payer: Self-pay | Admitting: Endocrinology

## 2020-09-28 ENCOUNTER — Telehealth: Payer: Self-pay | Admitting: Endocrinology

## 2020-09-28 DIAGNOSIS — M810 Age-related osteoporosis without current pathological fracture: Secondary | ICD-10-CM | POA: Diagnosis not present

## 2020-09-28 LAB — VITAMIN D 25 HYDROXY (VIT D DEFICIENCY, FRACTURES): VITD: 30.38 ng/mL (ref 30.00–100.00)

## 2020-09-28 MED ORDER — IBANDRONATE SODIUM 150 MG PO TABS: 150.0000 mg | ORAL_TABLET | ORAL | 3 refills | Status: DC

## 2020-09-28 NOTE — Patient Instructions (Signed)
Blood tests are requested for you today.  We'll let you know about the results.  Please continue the same Boniva. You should start taking "Prolia" (twice a year injection, given here at the office).  you will receive a phone call, about a day and time for an appointment Please come back for a follow-up appointment in 1 year.

## 2020-09-28 NOTE — Telephone Encounter (Signed)
Will route to Knute Neu, CMA to advise of PA for Prolia.   Thank you!

## 2020-09-28 NOTE — Telephone Encounter (Signed)
Please do PA for Prolia, thanks

## 2020-09-28 NOTE — Progress Notes (Signed)
Subjective:    Patient ID: Becky Marshall, female    DOB: 11-Jan-1951, 69 y.o.   MRN: [id number]  HPI Pt is referred by Dr Kenton Kingfisher, for osteoporosis.  Pt was noted to have osteoporosis in 2019; she was rx'ed Boniva.  she has had these bony fractures: left wrist (many years ago).  She has no history of any of the following: early menopause, multiple myeloma, renal dz, prolonged bedrest, steroids, alcoholism, smoking, liver dz, viT-d deficiency, or hyperparathyroidism.  She does not take heparin or anticonvulsants.  She reports leg cramps.  She takes vit-D, 2000 units/d.  Past Medical History:  Diagnosis Date  . Arthritis   . Diverticulitis   . GERD (gastroesophageal reflux disease)   . Hypothyroid   . Irregular heart beat   . Osteoporosis   . Pre-diabetes     Past Surgical History:  Procedure Laterality Date  . CAPSULOTOMY METATARSOPHALANGEAL Right 08/20/2017   Procedure: CAPSULOTOMY METATARSOPHALANGEAL-2ND & 3RD;  Surgeon: Albertine Patricia, DPM;  Location: [place];  Service: Podiatry;  Laterality: Right;  . DILATION AND CURETTAGE OF UTERUS    . HALLUX VALGUS LAPIDUS Right 08/20/2017   Procedure: HALLUX VALGUS LAPIDUS;  Surgeon: Albertine Patricia, DPM;  Location: [place];  Service: Podiatry;  Laterality: Right;  . WEIL OSTEOTOMY Right 08/20/2017   Procedure: WEIL OSTEOTOMY-2ND & 3RD TOES;  Surgeon: Albertine Patricia, DPM;  Location: [place];  Service: Podiatry;  Laterality: Right;    Social History   Socioeconomic History  . Marital status: Widowed    Spouse name: Not on file  . Number of children: Not on file  . Years of education: Not on file  . Highest education level: Not on file  Occupational History  . Not on file  Tobacco Use  . Smoking status: Never Smoker  . Smokeless tobacco: Never Used  Vaping Use  . Vaping Use: Never used  Substance and Sexual Activity  . Alcohol use: Yes    Alcohol/week: 5.0 standard drinks    Types: 5 Cans of  beer per week  . Drug use: No  . Sexual activity: Not Currently    Birth control/protection: Post-menopausal  Other Topics Concern  . Not on file  Social History Narrative  . Not on file   Social Determinants of Health   Financial Resource Strain:   . Difficulty of Paying Living Expenses: Not on file  Food Insecurity:   . Worried About Charity fundraiser in the Last Year: Not on file  . Ran Out of Food in the Last Year: Not on file  Transportation Needs:   . Lack of Transportation (Medical): Not on file  . Lack of Transportation (Non-Medical): Not on file  Physical Activity:   . Days of Exercise per Week: Not on file  . Minutes of Exercise per Session: Not on file  Stress:   . Feeling of Stress : Not on file  Social Connections:   . Frequency of Communication with Friends and Family: Not on file  . Frequency of Social Gatherings with Friends and Family: Not on file  . Attends Religious Services: Not on file  . Active Member of Clubs or Organizations: Not on file  . Attends Archivist Meetings: Not on file  . Marital Status: Not on file  Intimate Partner Violence:   . Fear of Current or Ex-Partner: Not on file  . Emotionally Abused: Not on file  . Physically Abused: Not on file  . Sexually  Abused: Not on file    Current Outpatient Medications on File Prior to Visit  Medication Sig Dispense Refill  . Acetaminophen (TYLENOL ARTHRITIS PAIN PO) Take 650 mg by mouth.    Marland Kitchen aspirin EC 81 MG tablet Take by mouth.    Marland Kitchen atenolol (TENORMIN) 25 MG tablet     . Calcium Carb-Cholecalciferol (CALCIUM 1000 + D PO) Take 750 mg by mouth.     . chlorpheniramine (CHLOR-TRIMETON) 4 MG tablet Take 4 mg by mouth every 4 (four) hours as needed for allergies.    . Cranberry 500 MG CAPS Take 500 mg by mouth.    . Flaxseed, Linseed, (FLAXSEED OIL) 1000 MG CAPS Take by mouth.    . fluticasone (FLONASE) 50 MCG/ACT nasal spray Place 1 spray into both nostrils daily.    Marland Kitchen levothyroxine  (SYNTHROID) 25 MCG tablet Take 25 mcg by mouth every morning.    . loperamide (IMODIUM A-D) 2 MG tablet Take 2 mg by mouth.     . Melatonin 5 MG CAPS Take by mouth.    . metroNIDAZOLE (METROGEL) 0.75 % gel APPLY A THIN LAYER TO FACE TWICE A DAY 45 g 3  . omega-3 fish oil (MAXEPA) 1000 MG CAPS capsule Take 1 capsule by mouth 2 (two) times daily.    Marland Kitchen omeprazole (PRILOSEC) 20 MG capsule Take by mouth.    . Probiotic Product (PROBIOTIC-10 PO) Take by mouth.    . pyridOXINE (VITAMIN B-6) 25 MG tablet Take by mouth.    . Cobalamine Combinations (B-12) 1000-400 MCG SUBL Place under the tongue.    . Magnesium 250 MG TABS Take by mouth. (Patient not taking: Reported on 09/28/2020)    . PRENAT-FECBN-FEBISG-FA-FISHOIL PO Take by mouth.    . Probiotic Product (PROBIOTIC-10 PO) Take by mouth.     No current facility-administered medications on file prior to visit.    Allergies  Allergen Reactions  . Hydrocodone Nausea Only    Family History  Problem Relation Age of Onset  . Breast cancer Paternal Aunt   . Osteoporosis Mother     BP 122/80   Pulse (!) 54   Ht 5' 2"  (1.575 m)   Wt 143 lb (64.9 kg)   SpO2 99%   BMI 26.16 kg/m    Review of Systems denies falls, memory loss, and back pain.  She has lost 20 lbs x 8 mos--intentional.  Heartburn is well-controlled.      Objective:   Physical Exam VITAL SIGNS:  See vs page.   GENERAL: no distress.   NECK: There is no palpable thyroid enlargement.  No thyroid nodule is palpable.  No palpable lymphadenopathy at the anterior neck. CHEST WALL: no kyphosis GAIT: normal and steady  25-OH vit-D=31  DEXA (2021): Lowest T-score was -2.6 (LFN)  I have reviewed outside records, and summarized: Pt was noted to have low BMD, and referred here. There was discussion about abd bloating, and prilosec was rx'ed      Assessment & Plan:  Osteoporosis, new to me, uncontrolled.   Patient Instructions  Blood tests are requested for you today.  We'll  let you know about the results.  Please continue the same Boniva. You should start taking "Prolia" (twice a year injection, given here at the office).  you will receive a phone call, about a day and time for an appointment Please come back for a follow-up appointment in 1 year.

## 2020-10-01 ENCOUNTER — Encounter: Payer: Self-pay | Admitting: Endocrinology

## 2020-10-01 LAB — PROTEIN ELECTROPHORESIS, SERUM
Albumin ELP: 4.3 g/dL (ref 3.8–4.8)
Alpha 1: 0.3 g/dL (ref 0.2–0.3)
Alpha 2: 0.8 g/dL (ref 0.5–0.9)
Beta 2: 0.4 g/dL (ref 0.2–0.5)
Beta Globulin: 0.5 g/dL (ref 0.4–0.6)
Gamma Globulin: 0.8 g/dL (ref 0.8–1.7)
Total Protein: 7.1 g/dL (ref 6.1–8.1)

## 2020-10-01 LAB — PTH, INTACT AND CALCIUM
Calcium: 9.5 mg/dL (ref 8.6–10.4)
PTH: 37 pg/mL (ref 14–64)

## 2020-11-06 ENCOUNTER — Ambulatory Visit: Payer: Medicare HMO | Admitting: Obstetrics & Gynecology

## 2020-11-06 ENCOUNTER — Other Ambulatory Visit: Payer: Medicare HMO

## 2020-11-22 ENCOUNTER — Encounter: Payer: Self-pay | Admitting: Endocrinology

## 2020-12-14 ENCOUNTER — Ambulatory Visit: Payer: Medicare HMO | Admitting: Obstetrics & Gynecology

## 2020-12-14 ENCOUNTER — Other Ambulatory Visit: Payer: Medicare HMO

## 2021-01-04 ENCOUNTER — Other Ambulatory Visit
Admission: RE | Admit: 2021-01-04 | Discharge: 2021-01-04 | Disposition: A | Discharge status: Home / Self Care | Payer: Medicare HMO | Source: Ambulatory Visit | Attending: Ophthalmology | Admitting: Ophthalmology

## 2021-01-04 ENCOUNTER — Other Ambulatory Visit: Payer: Self-pay | Admitting: Ophthalmology

## 2021-01-04 ENCOUNTER — Other Ambulatory Visit: Payer: Self-pay

## 2021-01-04 ENCOUNTER — Ambulatory Visit: Payer: Medicare HMO | Admitting: Obstetrics & Gynecology

## 2021-01-04 ENCOUNTER — Other Ambulatory Visit: Payer: Medicare HMO

## 2021-01-04 DIAGNOSIS — G453 Amaurosis fugax: Secondary | ICD-10-CM | POA: Diagnosis present

## 2021-01-04 LAB — CBC
HCT: 37.7 % (ref 36.0–46.0)
Hemoglobin: 12.6 g/dL (ref 12.0–15.0)
MCH: 30.4 pg (ref 26.0–34.0)
MCHC: 33.4 g/dL (ref 30.0–36.0)
MCV: 90.8 fL (ref 80.0–100.0)
Platelets: 317 10*3/uL (ref 150–400)
RBC: 4.15 MIL/uL (ref 3.87–5.11)
RDW: 12.9 % (ref 11.5–15.5)
WBC: 7.9 10*3/uL (ref 4.0–10.5)
nRBC: 0 % (ref 0.0–0.2)

## 2021-01-04 LAB — SEDIMENTATION RATE: Sed Rate: 37 mm/hr — ABNORMAL HIGH (ref 0–22)

## 2021-01-06 LAB — C-REACTIVE PROTEIN: CRP: 1.1 mg/dL — ABNORMAL HIGH (ref <1.0)

## 2021-01-09 ENCOUNTER — Other Ambulatory Visit: Payer: Self-pay | Admitting: Gastroenterology

## 2021-01-09 DIAGNOSIS — R1013 Epigastric pain: Secondary | ICD-10-CM

## 2021-01-09 DIAGNOSIS — R141 Gas pain: Secondary | ICD-10-CM

## 2021-01-09 DIAGNOSIS — R142 Eructation: Secondary | ICD-10-CM

## 2021-01-09 DIAGNOSIS — R140 Abdominal distension (gaseous): Secondary | ICD-10-CM

## 2021-01-11 ENCOUNTER — Ambulatory Visit
Admission: RE | Admit: 2021-01-11 | Discharge: 2021-01-11 | Disposition: A | Discharge status: Home / Self Care | Payer: Medicare HMO | Source: Ambulatory Visit | Attending: Ophthalmology | Admitting: Ophthalmology

## 2021-01-11 ENCOUNTER — Other Ambulatory Visit: Payer: Self-pay

## 2021-01-11 DIAGNOSIS — G453 Amaurosis fugax: Secondary | ICD-10-CM | POA: Insufficient documentation

## 2021-01-18 ENCOUNTER — Ambulatory Visit: Payer: Medicare HMO | Admitting: Obstetrics & Gynecology

## 2021-01-18 ENCOUNTER — Other Ambulatory Visit: Payer: Medicare HMO

## 2021-01-24 ENCOUNTER — Other Ambulatory Visit: Payer: Self-pay | Admitting: Obstetrics & Gynecology

## 2021-01-24 DIAGNOSIS — N83209 Unspecified ovarian cyst, unspecified side: Secondary | ICD-10-CM

## 2021-01-25 ENCOUNTER — Ambulatory Visit
Admission: RE | Admit: 2021-01-25 | Discharge: 2021-01-25 | Disposition: A | Discharge status: Home / Self Care | Payer: Medicare HMO | Source: Ambulatory Visit | Attending: Gastroenterology | Admitting: Gastroenterology

## 2021-01-25 ENCOUNTER — Other Ambulatory Visit: Payer: Self-pay

## 2021-01-25 DIAGNOSIS — R140 Abdominal distension (gaseous): Secondary | ICD-10-CM | POA: Insufficient documentation

## 2021-01-25 DIAGNOSIS — R1013 Epigastric pain: Secondary | ICD-10-CM | POA: Diagnosis present

## 2021-01-25 DIAGNOSIS — R141 Gas pain: Secondary | ICD-10-CM | POA: Diagnosis present

## 2021-01-25 DIAGNOSIS — R143 Flatulence: Secondary | ICD-10-CM | POA: Diagnosis present

## 2021-01-25 DIAGNOSIS — R142 Eructation: Secondary | ICD-10-CM | POA: Diagnosis present

## 2021-01-25 LAB — POCT I-STAT CREATININE: Creatinine, Ser: 0.8 mg/dL (ref 0.44–1.00)

## 2021-01-25 MED ORDER — IOHEXOL 300 MG/ML  SOLN
100.0000 mL | Freq: Once | INTRAMUSCULAR | Status: AC | PRN
  Administered 2021-01-25: 100 mL via INTRAVENOUS

## 2021-01-28 ENCOUNTER — Telehealth: Payer: Self-pay

## 2021-01-28 NOTE — Telephone Encounter (Addendum)
Left a message for the patient to return the call.  Pt rtn'd my call. I explained our u/s situation and why her appt was rescheduled. I also adv that I would need to reschedule her clinic appt w Tiburcio Pea that was scheduled to follow the u/s.   She adv that she had a CT done on 01/25/21. It was ordered by another provider. She was wondering if Tiburcio Pea would be able to review it and also questioned if that study would satisfy the need of the repeat u/s. I told her I would ask Harris and follow up with her.

## 2021-01-28 NOTE — Telephone Encounter (Signed)
Let her know, no cyst seen on CT, and as last [place] they were very small, this result is very reassuring and we do not need the ultrasound at this time.  Can consider if new sx's develop.

## 2021-01-29 NOTE — Telephone Encounter (Signed)
Left message to advise pt of [facility] message ?

## 2021-02-01 ENCOUNTER — Ambulatory Visit: Payer: Medicare HMO

## 2021-02-01 ENCOUNTER — Ambulatory Visit: Payer: Medicare HMO | Admitting: Obstetrics & Gynecology

## 2021-02-01 DIAGNOSIS — N83209 Unspecified ovarian cyst, unspecified side: Secondary | ICD-10-CM

## 2021-02-13 ENCOUNTER — Ambulatory Visit: Payer: Medicare HMO

## 2021-05-08 ENCOUNTER — Ambulatory Visit: Payer: Medicare HMO | Admitting: Dermatology

## 2021-05-09 ENCOUNTER — Ambulatory Visit: Payer: Medicare HMO | Admitting: Dermatology

## 2021-05-09 ENCOUNTER — Other Ambulatory Visit: Payer: Self-pay

## 2021-05-09 DIAGNOSIS — L821 Other seborrheic keratosis: Secondary | ICD-10-CM | POA: Diagnosis not present

## 2021-05-09 DIAGNOSIS — L719 Rosacea, unspecified: Secondary | ICD-10-CM

## 2021-05-09 DIAGNOSIS — L820 Inflamed seborrheic keratosis: Secondary | ICD-10-CM

## 2021-05-09 NOTE — Progress Notes (Signed)
   Follow-Up Visit   Subjective  Becky Marshall is a 70 y.o. female who presents for the following: Rosacea (Recheck rosacea on her face, treating with Metrogel and La Roche-Posay). Pt c/o  irritated growths on her face and back.   The following portions of the chart were reviewed this encounter and updated as appropriate:   Tobacco  Allergies  Meds  Problems  Med Hx  Surg Hx  Fam Hx      Review of Systems:  No other skin or systemic complaints except as noted in HPI or Assessment and Plan.  Objective  Well appearing patient in no apparent distress; mood and affect are within normal limits.  A focused examination was performed including face,back. Relevant physical exam findings are noted in the Assessment and Plan.  Right Upper Back x 1, left lower eyelid x 1  (2) (2), right lower eyelid margin lateral  x 1 Erythematous keratotic or waxy stuck-on papule or plaque.   Head - Anterior (Face) Mid face erythema with telangiectasias +/- scattered inflammatory papules.    Assessment & Plan  Inflamed seborrheic keratosis right lower eyelid margin lateral  x 1; Right Upper Back x 1, left lower eyelid x 1  (2) (2)  Destruction of lesion - Right Upper Back x 1, left lower eyelid x 1  (2)  Destruction method: cryotherapy   Informed consent: discussed and consent obtained   Lesion destroyed using liquid nitrogen: Yes   Region frozen until ice ball extended beyond lesion: Yes   Outcome: patient tolerated procedure well with no complications   Post-procedure details: wound care instructions given    Rosacea Head - Anterior (Face)  Rosacea with occular rosacea  Rosacea is a chronic progressive skin condition usually affecting the face of adults, causing redness and/or acne bumps. It is treatable but not curable. It sometimes affects the eyes (ocular rosacea) as well. It may respond to topical and/or systemic medication and can flare with stress, sun exposure, alcohol, exercise and  some foods.  Daily application of broad spectrum spf 30+ sunscreen to face is recommended to reduce flares.   Recommend taking oral tablets due to occular rosacea  Pt decline oral tablets today   Start [facility] rosacea triple cream  Azelaic Acid: 15% Ivermectin: 1% Metronidazole: 1% Vehicle: Cream   Seborrheic Keratoses - Stuck-on, waxy, tan-brown papules and/or plaques  - Benign-appearing - Discussed benign etiology and prognosis. - Observe - Call for any changes    Return in about 3 months (around 08/09/2021) for recheck ISK at eyelid margin .  IAngelique Holm, CMA, am acting as scribe for Armida Sans, MD .  Documentation: I have reviewed the above documentation for accuracy and completeness, and I agree with the above.  Armida Sans, MD

## 2021-05-09 NOTE — Patient Instructions (Addendum)
Cryotherapy Aftercare  Wash gently with soap and water everyday.   Apply Vaseline and Band-Aid daily until healed.     Instructions for [facility] Medications  One or more of your medications was sent to the [facility] mail order compounding pharmacy. You will receive an email from them and can purchase the medicine through that link. It will then be mailed to your home at the address you confirmed. If for any reason you do not receive an email from them, please check your spam folder. If you still do not find the email, please let us know. [facility] phone number is [phone].   If you have any questions or concerns for your doctor, please call our main line at [phone] and press option 4 to reach your doctor's medical assistant. If no one answers, please leave a voicemail as directed and we will return your call as soon as possible. Messages left after 4 pm will be answered the following business day.   You may also send us a message via MyChart. We typically respond to MyChart messages within 1-2 business days.  For prescription refills, please ask your pharmacy to contact our office. Our fax number is [phone].  If you have an urgent issue when the clinic is closed that cannot wait until the next business day, you can page your doctor at the number below.    Please note that while we do our best to be available for urgent issues outside of office hours, we are not available 24/7.   If you have an urgent issue and are unable to reach us, you may choose to seek medical care at your doctor's office, retail clinic, urgent care center, or emergency room.  If you have a medical emergency, please immediately call 911 or go to the emergency department.  Pager Numbers  - Dr. Kowalski: [phone]  - Dr. Moye: [phone]  - Dr. Stewart: [phone]  In the event of inclement weather, please call our main line at [phone] for an update on the status of any  delays or closures.  Dermatology Medication Tips: Please keep the boxes that topical medications come in in order to help keep track of the instructions about where and how to use these. Pharmacies typically print the medication instructions only on the boxes and not directly on the medication tubes.   If your medication is too expensive, please contact our office at [phone] option 4 or send us a message through MyChart.   We are unable to tell what your co-pay for medications will be in advance as this is different depending on your insurance coverage. However, we may be able to find a substitute medication at lower cost or fill out paperwork to get insurance to cover a needed medication.   If a prior authorization is required to get your medication covered by your insurance company, please allow us 1-2 business days to complete this process.  Drug prices often vary depending on where the prescription is filled and some pharmacies may offer cheaper prices.  The website www.goodrx.com contains coupons for medications through different pharmacies. The prices here do not account for what the cost may be with help from insurance (it may be cheaper with your insurance), but the website can give you the price if you did not use any insurance.  - You can print the associated coupon and take it with your prescription to the pharmacy.  - You may also stop by our office during regular business hours   and pick up a GoodRx coupon card.  - If you need your prescription sent electronically to a different pharmacy, notify our office through [place] MyChart or by phone at [phone] option 4.  

## 2021-05-23 ENCOUNTER — Encounter: Payer: Self-pay | Admitting: Dermatology

## 2021-08-14 ENCOUNTER — Other Ambulatory Visit: Payer: Self-pay | Admitting: Family Medicine

## 2021-08-14 DIAGNOSIS — Z1231 Encounter for screening mammogram for malignant neoplasm of breast: Secondary | ICD-10-CM

## 2021-08-15 ENCOUNTER — Other Ambulatory Visit: Payer: Self-pay

## 2021-08-15 ENCOUNTER — Ambulatory Visit: Payer: Medicare HMO | Admitting: Dermatology

## 2021-08-15 DIAGNOSIS — Z86018 Personal history of other benign neoplasm: Secondary | ICD-10-CM

## 2021-08-15 DIAGNOSIS — L719 Rosacea, unspecified: Secondary | ICD-10-CM

## 2021-08-15 DIAGNOSIS — L710 Perioral dermatitis: Secondary | ICD-10-CM

## 2021-08-15 NOTE — Patient Instructions (Addendum)
Continue skin medicinal triple cream at face at bedtime D/c retinol at face Use vitamin C in morning    Rosacea  What is rosacea? Rosacea (say: ro-zay-sha) is a common skin disease that usually begins as a trend of flushing or blushing easily.  As rosacea progresses, a persistent redness in the center of the face will develop and may gradually spread beyond the nose and cheeks to the forehead and chin.  In some cases, the ears, chest, and back could be affected.  Rosacea may appear as tiny blood vessels or small red bumps that occur in crops.  Frequently they can contain pus, and are called "pustules".  If the bumps do not contain pus, they are referred to as "papules".  Rarely, in prolonged, untreated cases of rosacea, the oil glands of the nose and cheeks may become permanently enlarged.  This is called rhinophyma, and is seen more frequently in men.  Signs and Risks In its beginning stages, rosacea tends to come and go, which makes it difficult to recognize.  It can start as intermittent flushing of the face.  Eventually, blood vessels may become permanently visible.  Pustules and papules can appear, but can be mistaken for adult acne.  People of all races, ages, genders and ethnic groups are at risk of developing rosacea.  However, it is more common in women (especially around menopause) and adults with fair skin between the ages of 40 and 29.  Treatment Dermatologists typically recommend a combination of treatments to effectively manage rosacea.  Treatment can improve symptoms and may stop the progression of the rosacea.  Treatment may involve both topical and oral medications.  The tetracycline antibiotics are often used for their anti-inflammatory effect; however, because of the possibility of developing antibiotic resistance, they should not be used long term at full dose.  For dilated blood vessels the options include electrodessication (uses electric current through a small needle), laser  treatment, and cosmetics to hide the redness.   With all forms of treatment, improvement is a slow process, and patients may not see any results for the first 3-4 weeks.  It is very important to avoid the sun and other triggers.  Patients must wear sunscreen daily.  Skin Care Instructions: Cleanse the skin with a mild soap such as CeraVe cleanser, Cetaphil cleanser, or Dove soap once or twice daily as needed. Moisturize with Eucerin Redness Relief Daily Perfecting Lotion (has a subtle green tint), CeraVe Moisturizing Cream, or Oil of Olay Daily Moisturizer with sunscreen every morning and/or night as recommended. Makeup should be "non-comedogenic" (won't clog pores) and be labeled "for sensitive skin". Good choices for cosmetics are: Neutrogena, Almay, and Physician's Formula.  Any product with a green tint tends to offset a red complexion. If your eyes are dry and irritated, use artificial tears 2-3 times per day and cleanse the eyelids daily with baby shampoo.  Have your eyes examined at least every 2 years.  Be sure to tell your eye doctor that you have rosacea. Alcoholic beverages tend to cause flushing of the skin, and may make rosacea worse. Always wear sunscreen, protect your skin from extreme hot and cold temperatures, and avoid spicy foods, hot drinks, and mechanical irritation such as rubbing, scrubbing, or massaging the face.  Avoid harsh skin cleansers, cleansing masks, astringents, and exfoliation. If a particular product burns or makes your face feel tight, then it is likely to flare your rosacea. If you are having difficulty finding a sunscreen that you can tolerate,  you may try switching to a chemical-free sunscreen.  These are ones whose active ingredient is zinc oxide or titanium dioxide only.  They should also be fragrance free, non-comedogenic, and labeled for sensitive skin. Rosacea triggers may vary from person to person.  There are a variety of foods that have been reported to  trigger rosacea.  Some patients find that keeping a diary of what they were doing when they flared helps them avoid triggers.  If you have any questions or concerns for your doctor, please call our main line at [phone] and press option 4 to reach your doctor's medical assistant. If no one answers, please leave a voicemail as directed and we will return your call as soon as possible. Messages left after 4 pm will be answered the following business day.   You may also send [place] a message via MyChart. We typically respond to MyChart messages within 1-2 business days.  For prescription refills, please ask your pharmacy to contact our office. Our fax number is [phone].  If you have an urgent issue when the clinic is closed that cannot wait until the next business day, you can page your doctor at the number below.    Please note that while we do our best to be available for urgent issues outside of office hours, we are not available 24/7.   If you have an urgent issue and are unable to reach [place], you may choose to seek medical care at your doctor's office, retail clinic, urgent care center, or emergency room.  If you have a medical emergency, please immediately call 911 or go to the emergency department.  Pager Numbers  - Dr. Gwen Pounds: [phone]  - Dr. Neale Burly: [phone]  - Dr. Roseanne Reno: [phone]  In the event of inclement weather, please call our main line at [phone] for an update on the status of any delays or closures.  Dermatology Medication Tips: Please keep the boxes that topical medications come in in order to help keep track of the instructions about where and how to use these. Pharmacies typically print the medication instructions only on the boxes and not directly on the medication tubes.   If your medication is too expensive, please contact our office at [phone] option 4 or send [place] a message through MyChart.   We are unable to tell what your co-pay for  medications will be in advance as this is different depending on your insurance coverage. However, we may be able to find a substitute medication at lower cost or fill out paperwork to get insurance to cover a needed medication.   If a prior authorization is required to get your medication covered by your insurance company, please allow [place] 1-2 business days to complete this process.  Drug prices often vary depending on where the prescription is filled and some pharmacies may offer cheaper prices.  The website www.goodrx.com contains coupons for medications through different pharmacies. The prices here do not account for what the cost may be with help from insurance (it may be cheaper with your insurance), but the website can give you the price if you did not use any insurance.  - You can print the associated coupon and take it with your prescription to the pharmacy.  - You may also stop by our office during regular business hours and pick up a GoodRx coupon card.  - If you need your prescription sent electronically to a different pharmacy, notify our office through [facility] or by phone at  [phone] option 4.

## 2021-08-15 NOTE — Progress Notes (Signed)
   Follow-Up Visit   Subjective  Becky Marshall is a 70 y.o. female who presents for the following: Follow-up (Patient here today for follow up for some isk frozen at eyelid margins. She reports today they are clear and not bothering her. She also reports concerns with rosacea triple cream and that it stings when applied to neck. Would also like to discuss if retinol and vitamin c can be applied to her face with rosacea. ).  The following portions of the chart were reviewed this encounter and updated as appropriate:  Tobacco  Allergies  Meds  Problems  Med Hx  Surg Hx  Fam Hx     Review of Systems: No other skin or systemic complaints except as noted in HPI or Assessment and Plan.  Objective  Well appearing patient in no apparent distress; mood and affect are within normal limits.  A focused examination was performed including face, eyelids , and neck. Relevant physical exam findings are noted in the Assessment and Plan.  Head - Anterior (Face) Mid face erythema with telangiectasias  Assessment & Plan  - History of isk at left upper eyelid and right lower eyelid margin lateral x 1 Clear today at follow up today.  Rosacea Erythrotelangiectatic and papulopustular Better controlled with papules on Skin Medicinals triple rosacea cream.  Continue nightly.   She still has some redness.   Discussed BBL laser treatment for redness and telangiectasias.   Also had a long discussion on use of products.  She may want to avoid her over-the-counter topical retinoid (Retinol) as it can be irritating.  She may continue vitamin C product.   Discussed foods that can exacerbate rosacea and may want avoid these.    Rosacea is a chronic progressive skin condition usually affecting the face of adults, causing redness and/or acne bumps. It is treatable but not curable. It sometimes affects the eyes (ocular rosacea) as well. It may respond to topical and/or systemic medication and can flare with stress,  sun exposure, alcohol, exercise and some foods.  Daily application of broad spectrum spf 30+ sunscreen to face is recommended to reduce flares.  Return in about 1 year (around 08/15/2022) for follow up on rosacea . IAsher Muir, CMA, am acting as scribe for Armida Sans, MD. Documentation: I have reviewed the above documentation for accuracy and completeness, and I agree with the above.  Armida Sans, MD

## 2021-08-16 ENCOUNTER — Encounter: Payer: Self-pay | Admitting: Dermatology

## 2021-09-11 IMAGING — CT CT ABD-PELV W/ CM
2 of 5 series · 17 of 46 positions shown, 19 images · IV contrast (omnipaque)
Comparison: None.

CLINICAL DATA: Bloating, right lower quadrant pain

EXAM:
CT ABDOMEN AND PELVIS WITH CONTRAST
TECHNIQUE: Multidetector CT imaging of the abdomen and pelvis was performed
using the standard protocol following bolus administration of
intravenous contrast.
CONTRAST:  100mL OMNIPAQUE IOHEXOL 300 MG/ML  SOLN

[Series 2: abd pelvis 5.00 · axial · 0.76mm/px · z∈[-1519,-1149]mm · 14 of 84 slices shown, 16 images]
[im 5/84  soft-tissue]
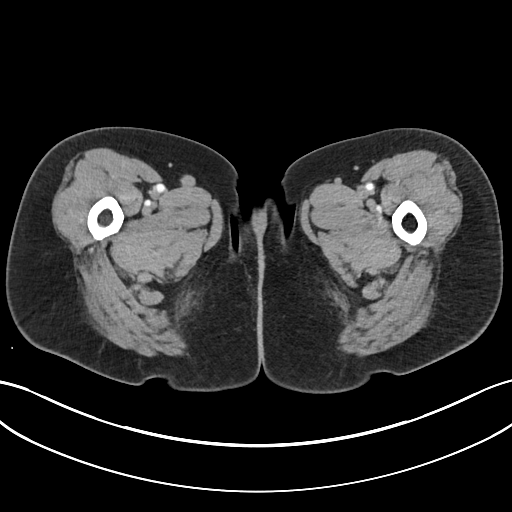
[im 5/84  bone]
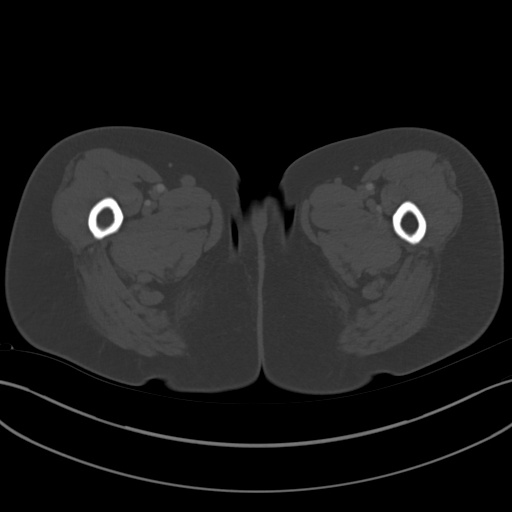
[im 13/84  soft-tissue]
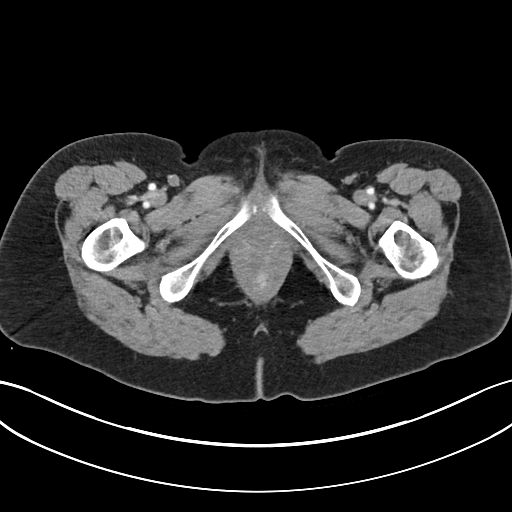
[im 17/84  soft-tissue]
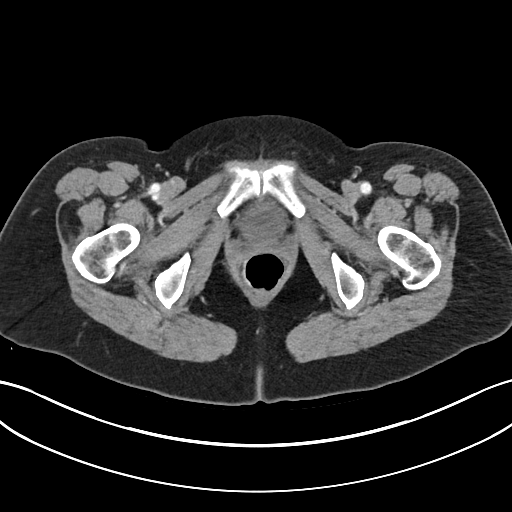
[im 21/84  soft-tissue]
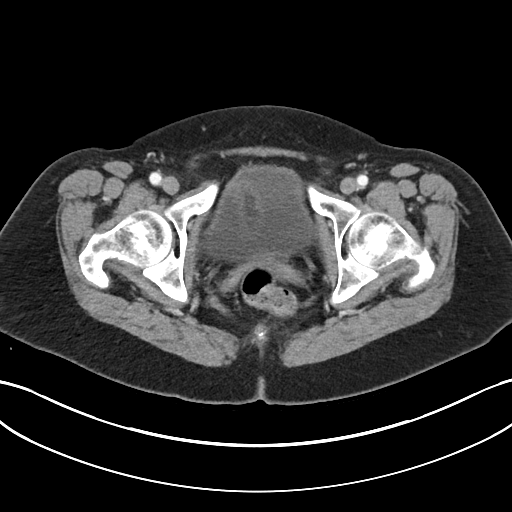
[im 30/84  soft-tissue]
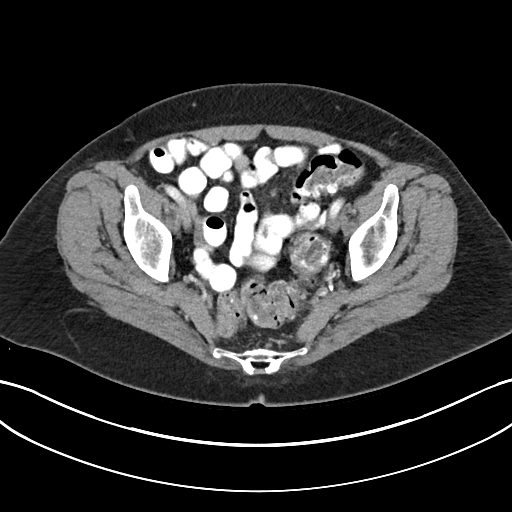
[im 34/84  soft-tissue]
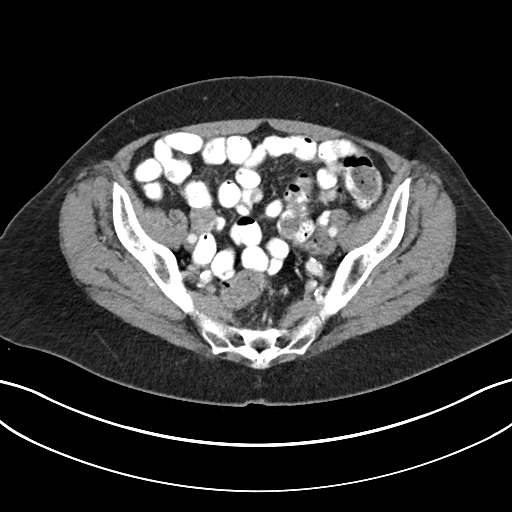
[im 38/84  soft-tissue]
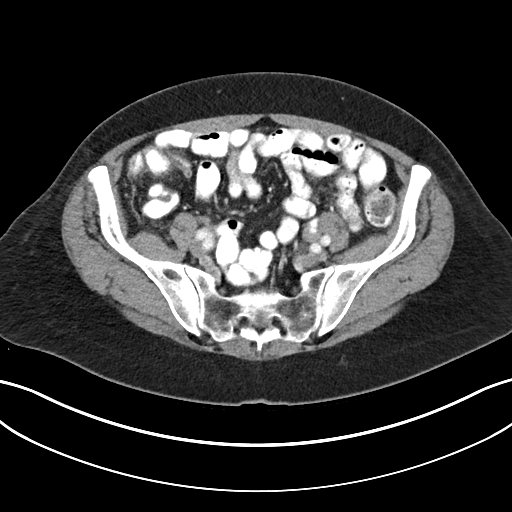
[im 46/84  soft-tissue]
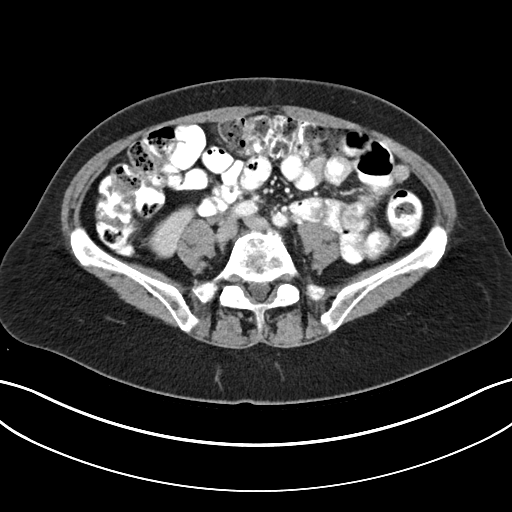
[im 50/84  soft-tissue]
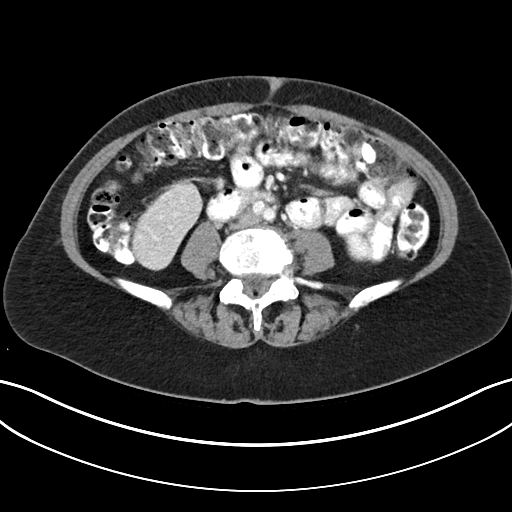
[im 50/84  bone]
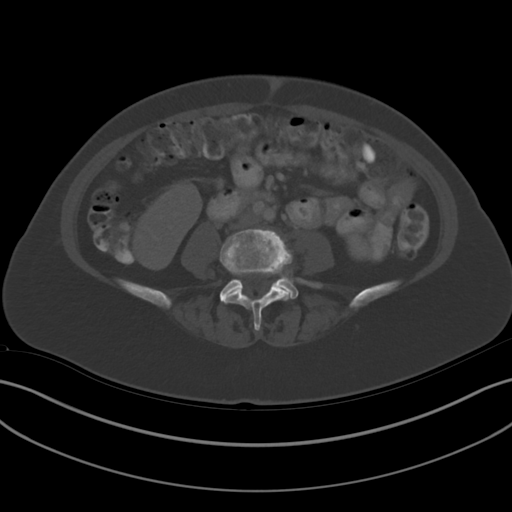
[im 54/84  soft-tissue]
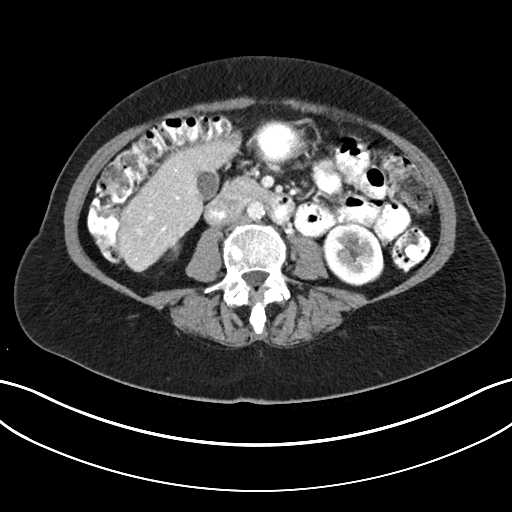
[im 63/84  soft-tissue]
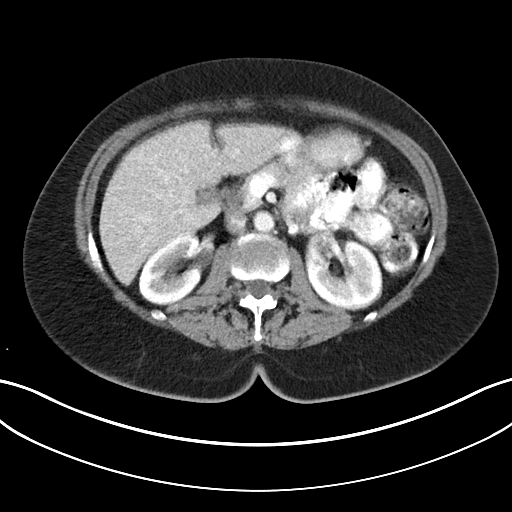
[im 67/84  soft-tissue]
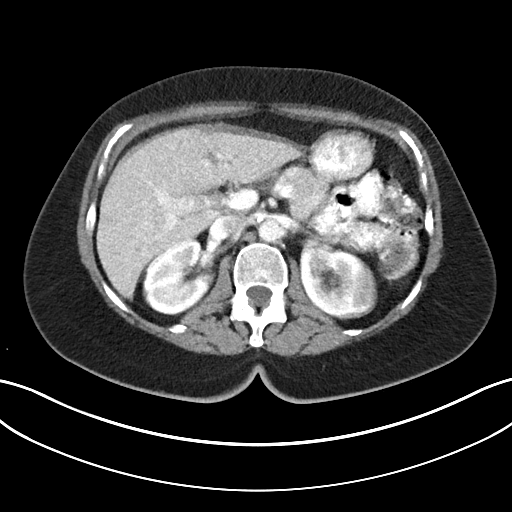
[im 71/84  soft-tissue]
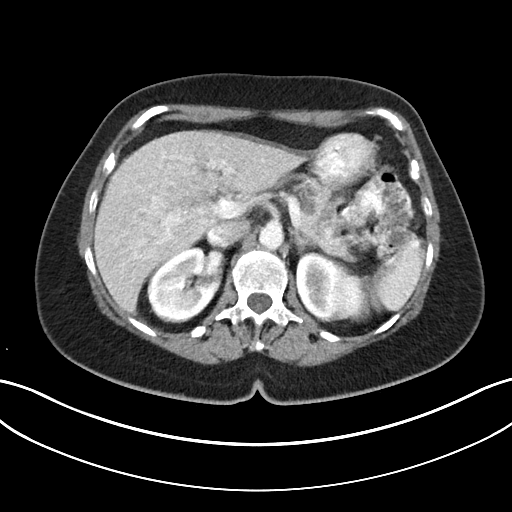
[im 79/84  soft-tissue]
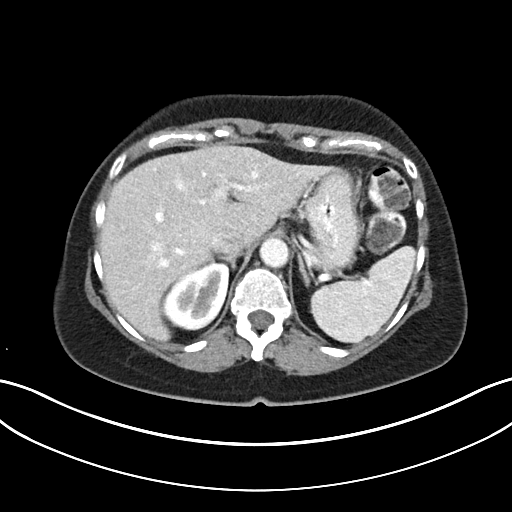

[Series 4: coronals abd pelvis 2.00 cor · coronal · 0.76mm/px · 3 of 137 slices shown]
[im 46/137  soft-tissue]
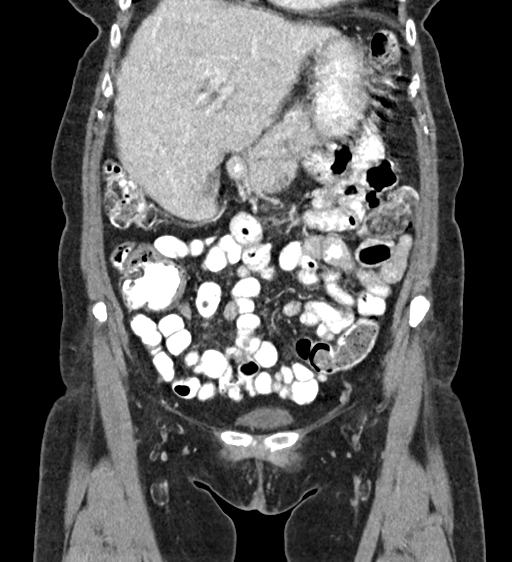
[im 61/137  soft-tissue]
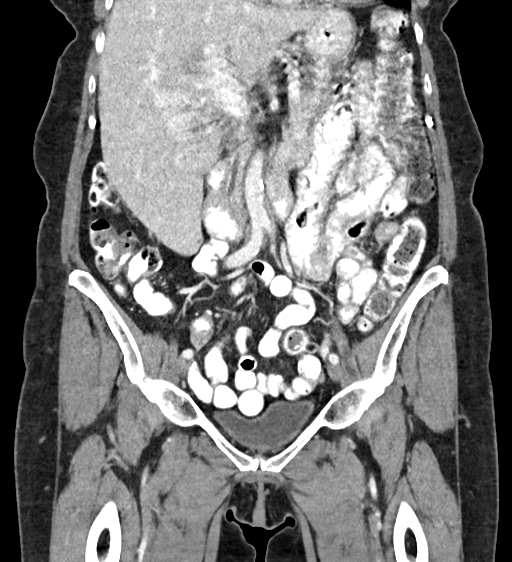
[im 76/137  soft-tissue]
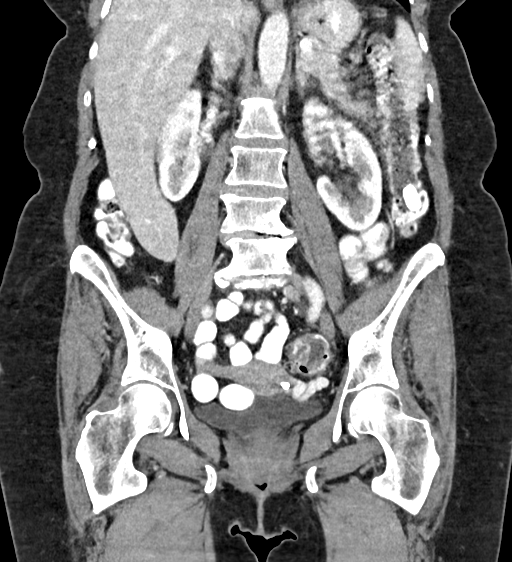

[17 of 46 positions shown; findings below may reference images not displayed]

FINDINGS: Lower chest: No acute abnormality.

Hepatobiliary: Liver is not imaged in its entirety. Gallbladder
grossly unremarkable. No focal hepatic abnormality in the visualized
liver.

Pancreas: No focal abnormality or ductal dilatation.

Spleen: Spleen is not imaged in its entirety. No visible focal
abnormality. Normal size.

Adrenals/Urinary Tract: No adrenal abnormality. No focal renal
abnormality. No stones or hydronephrosis. Urinary bladder is
unremarkable.

Stomach/Bowel: Sigmoid diverticulosis. Large stool burden throughout
the colon. Normal appendix.

Vascular/Lymphatic: Scattered aortic atherosclerosis. No evidence of
aneurysm or adenopathy.

Reproductive: Uterus and adnexa unremarkable.  No mass.

Other: No free fluid or free air.

Musculoskeletal: No acute bony abnormality. Degenerative changes in
the lower lumbar spine.
IMPRESSION: Sigmoid diverticulosis.  No active diverticulitis.

Large stool burden throughout the colon.

Normal appendix.

No acute findings.

## 2021-09-25 ENCOUNTER — Other Ambulatory Visit: Payer: Self-pay

## 2021-09-25 ENCOUNTER — Ambulatory Visit
Admission: RE | Admit: 2021-09-25 | Discharge: 2021-09-25 | Disposition: A | Discharge status: Home / Self Care | Payer: Medicare HMO | Source: Ambulatory Visit | Attending: Family Medicine | Admitting: Family Medicine

## 2021-09-25 DIAGNOSIS — Z1231 Encounter for screening mammogram for malignant neoplasm of breast: Secondary | ICD-10-CM | POA: Insufficient documentation

## 2021-09-30 ENCOUNTER — Other Ambulatory Visit: Payer: Self-pay | Admitting: Obstetrics & Gynecology

## 2021-09-30 DIAGNOSIS — M810 Age-related osteoporosis without current pathological fracture: Secondary | ICD-10-CM

## 2021-10-04 ENCOUNTER — Ambulatory Visit: Payer: Medicare HMO | Admitting: Endocrinology

## 2021-10-30 ENCOUNTER — Ambulatory Visit (INDEPENDENT_AMBULATORY_CARE_PROVIDER_SITE_OTHER): Payer: Medicare HMO | Admitting: Obstetrics & Gynecology

## 2021-10-30 ENCOUNTER — Encounter: Payer: Self-pay | Admitting: Obstetrics & Gynecology

## 2021-10-30 ENCOUNTER — Other Ambulatory Visit: Payer: Self-pay

## 2021-10-30 VITALS — BP 120/80 | Ht 61.5 in | Wt 140.0 lb

## 2021-10-30 DIAGNOSIS — M810 Age-related osteoporosis without current pathological fracture: Secondary | ICD-10-CM

## 2021-10-30 DIAGNOSIS — Z01419 Encounter for gynecological examination (general) (routine) without abnormal findings: Secondary | ICD-10-CM

## 2021-10-30 MED ORDER — IBANDRONATE SODIUM 150 MG PO TABS: ORAL_TABLET | ORAL | 3 refills | Status: DC

## 2021-10-30 NOTE — Progress Notes (Signed)
HPI:      Ms. Becky Marshall is a 70 y.o. G2P2002 who LMP was in the past, she presents today for her annual examination.  The patient has no complaints today. The patient is not currently sexually active. Herlast pap: approximate date 2021 and was normal and last mammogram: approximate date 09/2021 and was normal.  The patient does perform self breast exams.  There is no notable family history of breast or ovarian cancer in her family. The patient is not taking hormone replacement therapy. Patient denies post-menopausal vaginal bleeding.   The patient has regular exercise: yes. The patient denies current symptoms of depression.    GYN Hx: Last Colonoscopy: 2022  . Normal.  Last DEXA: year ago.    PMHx: Past Medical History:  Diagnosis Date   Arthritis    Diverticulitis    GERD (gastroesophageal reflux disease)    Hypothyroid    Irregular heart beat    Osteoporosis    Pre-diabetes    Past Surgical History:  Procedure Laterality Date   CAPSULOTOMY METATARSOPHALANGEAL Right 08/20/2017   Procedure: CAPSULOTOMY METATARSOPHALANGEAL-2ND & 3RD;  Surgeon: Albertine Patricia, DPM;  Location: [place];  Service: Podiatry;  Laterality: Right;   DILATION AND CURETTAGE OF UTERUS     HALLUX VALGUS LAPIDUS Right 08/20/2017   Procedure: HALLUX VALGUS LAPIDUS;  Surgeon: Albertine Patricia, DPM;  Location: [place];  Service: Podiatry;  Laterality: Right;   WEIL OSTEOTOMY Right 08/20/2017   Procedure: WEIL OSTEOTOMY-2ND & 3RD TOES;  Surgeon: Albertine Patricia, DPM;  Location: [place];  Service: Podiatry;  Laterality: Right;   Family History  Problem Relation Age of Onset   Breast cancer Paternal Aunt    Osteoporosis Mother    Social History   Tobacco Use   Smoking status: Never   Smokeless tobacco: Never  Vaping Use   Vaping Use: Never used  Substance Use Topics   Alcohol use: Yes    Alcohol/week: 5.0 standard drinks    Types: 5 Cans of beer per week   Drug use:  No    Current Outpatient Medications:    Acetaminophen (TYLENOL ARTHRITIS PAIN PO), Take 650 mg by mouth., Disp: , Rfl:    atenolol (TENORMIN) 25 MG tablet, , Disp: , Rfl:    Calcium Carb-Cholecalciferol (CALCIUM 1000 + D PO), Take 750 mg by mouth. , Disp: , Rfl:    chlorpheniramine (CHLOR-TRIMETON) 4 MG tablet, Take 4 mg by mouth every 4 (four) hours as needed for allergies., Disp: , Rfl:    Cranberry 500 MG CAPS, Take 500 mg by mouth., Disp: , Rfl:    Flaxseed, Linseed, (FLAXSEED OIL) 1000 MG CAPS, Take by mouth., Disp: , Rfl:    fluticasone (FLONASE) 50 MCG/ACT nasal spray, Place 1 spray into both nostrils daily., Disp: , Rfl:    hyoscyamine (LEVBID) 0.375 MG 12 hr tablet, SMARTSIG:1 Tablet(s) By Mouth Every 12 Hours PRN, Disp: , Rfl:    ibandronate (BONIVA) 150 MG tablet, TAKE 1 TABLET EVERY 30 DAYS TAKE IN MORNING WITH A GLASS OF WATER ON EMPTY STOMACH. DO NOT LIE DOWN FOR 60 MINUTES, Disp: 4 tablet, Rfl: 1   levothyroxine (SYNTHROID) 25 MCG tablet, Take 25 mcg by mouth every morning., Disp: , Rfl:    loperamide (IMODIUM A-D) 2 MG tablet, Take 2 mg by mouth. , Disp: , Rfl:    Magnesium 250 MG TABS, Take by mouth., Disp: , Rfl:    metroNIDAZOLE (METROGEL) 0.75 % gel, APPLY A THIN  LAYER TO FACE TWICE A DAY, Disp: 45 g, Rfl: 3   omega-3 fish oil (MAXEPA) 1000 MG CAPS capsule, Take 1 capsule by mouth 2 (two) times daily., Disp: , Rfl:    PRENAT-FECBN-FEBISG-FA-FISHOIL PO, Take by mouth., Disp: , Rfl:    Probiotic Product (PROBIOTIC-10 PO), Take by mouth., Disp: , Rfl:    Probiotic Product (PROBIOTIC-10 PO), Take by mouth., Disp: , Rfl:    pyridOXINE (VITAMIN B-6) 25 MG tablet, Take by mouth., Disp: , Rfl:    omeprazole (PRILOSEC) 20 MG capsule, Take by mouth., Disp: , Rfl:  Allergies: Hydrocodone  Review of Systems  Constitutional:  Negative for chills, fever and malaise/fatigue.  HENT:  Negative for congestion, sinus pain and sore throat.   Eyes:  Negative for blurred vision and pain.   Respiratory:  Negative for cough and wheezing.   Cardiovascular:  Negative for chest pain and leg swelling.  Gastrointestinal:  Negative for abdominal pain, constipation, diarrhea, heartburn, nausea and vomiting.  Genitourinary:  Negative for dysuria, frequency, hematuria and urgency.  Musculoskeletal:  Negative for back pain, joint pain, myalgias and neck pain.  Skin:  Negative for itching and rash.  Neurological:  Negative for dizziness, tremors and weakness.  Endo/Heme/Allergies:  Does not bruise/bleed easily.  Psychiatric/Behavioral:  Negative for depression. The patient is not nervous/anxious and does not have insomnia.    Objective: BP 120/80    Ht 5' 1.5" (1.562 m)    Wt 140 lb (63.5 kg)    BMI 26.02 kg/m   Filed Weights   10/30/21 1004  Weight: 140 lb (63.5 kg)   Body mass index is 26.02 kg/m. Physical Exam Constitutional:      General: She is not in acute distress.    Appearance: She is well-developed.  Genitourinary:     Bladder, rectum and urethral meatus normal.     No lesions in the vagina.     Right Labia: No rash, tenderness or lesions.    Left Labia: No tenderness, lesions or rash.    No vaginal bleeding.      Right Adnexa: not tender and no mass present.    Left Adnexa: not tender and no mass present.    No cervical motion tenderness, friability, lesion or polyp.     Uterus is not enlarged.     No uterine mass detected.    Pelvic exam was performed with patient in the lithotomy position.  Breasts:    Right: No mass, skin change or tenderness.     Left: No mass, skin change or tenderness.  HENT:     Head: Normocephalic and atraumatic. No laceration.     Right Ear: Hearing normal.     Left Ear: Hearing normal.     Mouth/Throat:     Pharynx: Uvula midline.  Eyes:     Pupils: Pupils are equal, round, and reactive to light.  Neck:     Thyroid: No thyromegaly.  Cardiovascular:     Rate and Rhythm: Normal rate and regular rhythm.     Heart sounds: No  murmur heard.   No friction rub. No gallop.  Pulmonary:     Effort: Pulmonary effort is normal. No respiratory distress.     Breath sounds: Normal breath sounds. No wheezing.  Abdominal:     General: Bowel sounds are normal. There is no distension.     Palpations: Abdomen is soft.     Tenderness: There is no abdominal tenderness. There is no rebound.  Musculoskeletal:  General: Normal range of motion.     Cervical back: Normal range of motion and neck supple.  Neurological:     Mental Status: She is alert and oriented to person, place, and time.     Cranial Nerves: No cranial nerve deficit.  Skin:    General: Skin is warm and dry.  Psychiatric:        Judgment: Judgment normal.  Vitals reviewed.    Assessment: Annual Exam 1. Women's annual routine gynecological examination   2. Age-related osteoporosis without current pathological fracture     Plan:            1.  Cervical Screening-  Pap smear schedule reviewed with patient  2. Breast screening- Exam annually and mammogram scheduled  3. Colonoscopy every 10 years, Hemoccult testing after age 68  4. Labs managed by PCP  5. Counseling for hormonal therapy: none              6. FRAX - FRAX score for assessing the 10 year probability for fracture calculated and discussed today.  Based on age and score today, DEXA is planned for in 2023. She will cont Boniva monthly She is seeing chiropractor for therapy to help improve bone mass She did not desire to take Prolia    F/U  Return in about 1 year (around 10/30/2022) for Annual.  Barnett Applebaum, MD, [facility], [facility] 10/30/2021  10:30 AM

## 2021-10-30 NOTE — Patient Instructions (Signed)
Recommendations to boost your immunity to prevent illness such as viral flu and colds, including covid19, are as follows:       - - -  Vitamin K2 and Vitamin D3  - - - Take Vitamin K2 at 200-300 mcg daily (usually 2-3 pills daily of the over the counter formulation). Take Vitamin D3 at 3000-4000 U daily (usually 3-4 pills daily of the over the counter formulation). Studies show that these two at high normal levels in your system are very effective in keeping your immunity so strong and protective that you will be unlikely to contract viral illness such as those listed above.  Dr Tiburcio Pea  Kegel Exercises Kegel exercises can help strengthen your pelvic floor muscles. The pelvic floor is a group of muscles that support your rectum, small intestine, and bladder. In females, pelvic floor muscles also help support the uterus. These muscles help you control the flow of urine and stool (feces). Kegel exercises are painless and simple. They do not require any equipment. Your provider may suggest Kegel exercises to: Improve bladder and bowel control. Improve sexual response. Improve weak pelvic floor muscles after surgery to remove the uterus (hysterectomy) or after pregnancy, in females. Improve weak pelvic floor muscles after prostate gland removal or surgery, in males. Kegel exercises involve squeezing your pelvic floor muscles. These are the same muscles you squeeze when you try to stop the flow of urine or keep from passing gas. The exercises can be done while sitting, standing, or lying down, but it is best to vary your position. Ask your health care provider which exercises are safe for you. Do exercises exactly as told by your health care provider and adjust them as directed. Do not begin these exercises until told by your health care provider. Exercises How to do Kegel exercises: Squeeze your pelvic floor muscles tight. You should feel a tight lift in your rectal area. If you are a female, you  should also feel a tightness in your vaginal area. Keep your stomach, buttocks, and legs relaxed. Hold the muscles tight for up to 10 seconds. Breathe normally. Relax your muscles for up to 10 seconds. Repeat as told by your health care provider. Repeat this exercise daily as told by your health care provider. Continue to do this exercise for at least 4-6 weeks, or for as long as told by your health care provider. You may be referred to a physical therapist who can help you learn more about how to do Kegel exercises. Depending on your condition, your health care provider may recommend: Varying how long you squeeze your muscles. Doing several sets of exercises every day. Doing exercises for several weeks. Making Kegel exercises a part of your regular exercise routine. This information is not intended to replace advice given to you by your health care provider. Make sure you discuss any questions you have with your health care provider. Document Revised: 03/14/2021 Document Reviewed: 03/14/2021 Elsevier Patient Education  2022 [employer].

## 2022-01-24 ENCOUNTER — Encounter: Payer: Self-pay | Admitting: Obstetrics & Gynecology

## 2022-08-21 ENCOUNTER — Ambulatory Visit: Payer: Medicare HMO | Admitting: Dermatology

## 2022-08-21 DIAGNOSIS — L570 Actinic keratosis: Secondary | ICD-10-CM | POA: Diagnosis not present

## 2022-08-21 DIAGNOSIS — L719 Rosacea, unspecified: Secondary | ICD-10-CM | POA: Diagnosis not present

## 2022-08-21 DIAGNOSIS — L578 Other skin changes due to chronic exposure to nonionizing radiation: Secondary | ICD-10-CM

## 2022-08-21 DIAGNOSIS — L309 Dermatitis, unspecified: Secondary | ICD-10-CM

## 2022-08-21 MED ORDER — IVERMECTIN 1 % EX CREA: 1.0000 | TOPICAL_CREAM | Freq: Every day | CUTANEOUS | 11 refills | Status: AC

## 2022-08-21 NOTE — Patient Instructions (Addendum)
Instructions for [facility] Medications  One or more of your medications was sent to the [facility] mail order compounding pharmacy. You will receive an email from them and can purchase the medicine through that link. It will then be mailed to your home at the address you confirmed. If for any reason you do not receive an email from them, please check your spam folder. If you still do not find the email, please let us know. [facility] phone number is [phone].   Eczema Left cheek Start otc Hydrocortisone cream 1% nightly until resolved  Due to recent changes in healthcare laws, you may see results of your pathology and/or laboratory studies on MyChart before the doctors have had a chance to review them. We understand that in some cases there may be results that are confusing or concerning to you. Please understand that not all results are received at the same time and often the doctors may need to interpret multiple results in order to provide you with the best plan of care or course of treatment. Therefore, we ask that you please give [place] 2 business days to thoroughly review all your results before contacting the office for clarification. Should we see a critical lab result, you will be contacted sooner.   If You Need Anything After Your Visit  If you have any questions or concerns for your doctor, please call our main line at [phone] and press option 4 to reach your doctor's medical assistant. If no one answers, please leave a voicemail as directed and we will return your call as soon as possible. Messages left after 4 pm will be answered the following business day.   You may also send [place] a message via [place]. We typically respond to MyChart messages within 1-2 business days.  For prescription refills, please ask your pharmacy to contact our office. Our fax number is [phone].  If you have an urgent issue when the clinic is closed that cannot wait until the next  business day, you can page your doctor at the number below.    Please note that while we do our best to be available for urgent issues outside of office hours, we are not available 24/7.   If you have an urgent issue and are unable to reach [place], you may choose to seek medical care at your doctor's office, retail clinic, urgent care center, or emergency room.  If you have a medical emergency, please immediately call 911 or go to the emergency department.  Pager Numbers  - Dr. Nehemiah Massed: [phone]  - Dr. Laurence Ferrari: [phone]  - Dr. Nicole Kindred: [phone]  In the event of inclement weather, please call our main line at [phone] for an update on the status of any delays or closures.  Dermatology Medication Tips: Please keep the boxes that topical medications come in in order to help keep track of the instructions about where and how to use these. Pharmacies typically print the medication instructions only on the boxes and not directly on the medication tubes.   If your medication is too expensive, please contact our office at [phone] option 4 or send [place] a message through [place].   We are unable to tell what your co-pay for medications will be in advance as this is different depending on your insurance coverage. However, we may be able to find a substitute medication at lower cost or fill out paperwork to get insurance to cover a needed medication.   If a prior authorization is required  to get your medication covered by your insurance company, please allow [place] 1-2 business days to complete this process.  Drug prices often vary depending on where the prescription is filled and some pharmacies may offer cheaper prices.  The website www.goodrx.com contains coupons for medications through different pharmacies. The prices here do not account for what the cost may be with help from insurance (it may be cheaper with your insurance), but the website can give you the price if you did not use  any insurance.  - You can print the associated coupon and take it with your prescription to the pharmacy.  - You may also stop by our office during regular business hours and pick up a GoodRx coupon card.  - If you need your prescription sent electronically to a different pharmacy, notify our office through [facility] or by phone at [phone] option 4.     Si Usted Necesita Algo Despus de Su Visita  Tambin puede enviarnos un mensaje a travs de Pharmacist, community. Por lo general respondemos a los mensajes de MyChart en el transcurso de 1 a 2 das hbiles.  Para renovar recetas, por favor pida a su farmacia que se ponga en contacto con nuestra oficina. Harland Dingwall de fax es [place] [phone].  Si tiene un asunto urgente cuando la clnica est cerrada y que no puede esperar hasta el siguiente da hbil, puede llamar/localizar a su doctor(a) al nmero que aparece a continuacin.   Por favor, tenga en cuenta que aunque hacemos todo lo posible para estar disponibles para asuntos urgentes fuera del horario de [place], no estamos disponibles las 24 horas del da, los 7 das de la [place].   Si tiene un problema urgente y no puede comunicarse con nosotros, puede optar por buscar atencin mdica  en el consultorio de su doctor(a), en una clnica privada, en un centro de atencin urgente o en una sala de emergencias.  Si tiene Engineering geologist, por favor llame inmediatamente al 911 o vaya a la sala de emergencias.  Nmeros de bper  - Dr. Nehemiah Massed: [phone]  - Dra. Moye: [phone]  - Dra. Nicole Kindred: [phone]  En caso de inclemencias del [place], por favor llame a Johnsie Kindred principal al [phone] para una actualizacin sobre el [place] de cualquier retraso o cierre.  Consejos para la medicacin en dermatologa: Por favor, guarde las cajas en las que vienen los medicamentos de uso tpico para ayudarle a seguir las instrucciones sobre dnde y cmo usarlos. Las farmacias  generalmente imprimen las instrucciones del medicamento slo en las cajas y no directamente en los tubos del [place].   Si su medicamento es muy caro, por favor, pngase en contacto con Zigmund Daniel llamando al [phone] y presione la opcin 4 o envenos un mensaje a travs de Pharmacist, community.   No podemos decirle cul ser su copago por los medicamentos por adelantado ya que esto es diferente dependiendo de la cobertura de su seguro. Sin embargo, es posible que podamos encontrar un medicamento sustituto a Electrical engineer un formulario para que el seguro cubra el medicamento que se considera necesario.   Si se requiere una autorizacin previa para que su compaa de seguros [place] su medicamento, por favor permtanos de 1 a 2 das hbiles para completar este proceso.  Los precios de los medicamentos varan con frecuencia dependiendo del Environmental consultant de dnde se surte la receta y alguna farmacias pueden ofrecer precios ms baratos.  El sitio web www.goodrx.com tiene cupones para medicamentos de [employer]  farmacias. Los precios aqu no tienen en cuenta lo que podra costar con la ayuda del seguro (puede ser ms barato con su seguro), pero el sitio web puede darle el precio si no utiliz Research scientist (physical sciences).  - Puede imprimir el cupn correspondiente y llevarlo con su receta a la farmacia.  - Tambin puede pasar por nuestra oficina durante el horario de atencin regular y Charity fundraiser una tarjeta de cupones de GoodRx.  - Si necesita que su receta se enve electrnicamente a una farmacia diferente, informe a nuestra oficina a travs de MyChart de [place] o por telfono llamando al [phone] y presione la opcin 4.

## 2022-08-21 NOTE — Progress Notes (Signed)
   Follow-Up Visit   Subjective  Becky Marshall is a 71 y.o. female who presents for the following: Rosacea (Face, not using SM Triple cream) and check spots (L jaw, 1 yr, one spot red and peels, /L temple, 14m, bump that scales, hx of bleeding). The patient has spots, moles and lesions to be evaluated, some may be new or changing and the patient has concerns that these could be cancer.  The following portions of the chart were reviewed this encounter and updated as appropriate:   Tobacco  Allergies  Meds  Problems  Med Hx  Surg Hx  Fam Hx     Review of Systems:  No other skin or systemic complaints except as noted in HPI or Assessment and Plan.  Objective  Well appearing patient in no apparent distress; mood and affect are within normal limits.  A focused examination was performed including face. Relevant physical exam findings are noted in the Assessment and Plan.  Head - Anterior (Face) Erythema and telangiectasias face  L infer cheek near the chin Pink patch 1.5cm  L temple x 1 Pink scaly macules   Assessment & Plan  Rosacea Head - Anterior (Face) Rosacea is a chronic progressive skin condition usually affecting the face of adults, causing redness and/or acne bumps. It is treatable but not curable. It sometimes affects the eyes (ocular rosacea) as well. It may respond to topical and/or systemic medication and can flare with stress, sun exposure, alcohol, exercise, topical steroids (including hydrocortisone/cortisone 10) and some foods.  Daily application of broad spectrum spf 30+ sunscreen to face is recommended to reduce flares.  May restart SM Triple Rosacea cram qhs prn flares  Ivermectin 1 % CREA - Head - Anterior (Face) Apply 1 Application topically at bedtime. Qhs to face for Rosacea  Eczema, unspecified type L inferior cheek near the chin Mild Start otc HC cream 1% qhs until resolved then prn flares  AK (actinic keratosis) L temple x 1 Destruction of lesion  - L temple x 1 Complexity: simple   Destruction method: cryotherapy   Informed consent: discussed and consent obtained   Timeout:  patient name, date of birth, surgical site, and procedure verified Lesion destroyed using liquid nitrogen: Yes   Region frozen until ice ball extended beyond lesion: Yes   Outcome: patient tolerated procedure well with no complications   Post-procedure details: wound care instructions given    Actinic Damage - chronic, secondary to cumulative UV radiation exposure/sun exposure over time - diffuse scaly erythematous macules with underlying dyspigmentation - Recommend daily broad spectrum sunscreen SPF 30+ to sun-exposed areas, reapply every 2 hours as needed.  - Recommend staying in the shade or wearing long sleeves, sun glasses (UVA+UVB protection) and wide brim hats (4-inch brim around the entire circumference of the hat). - Call for new or changing lesions.  Return in about 3 months (around 11/21/2022) for recheck AK L temple, Eczema L inf cheek near chin.  I, Othelia Pulling, RMA, am acting as scribe for Sarina Ser, MD . Documentation: I have reviewed the above documentation for accuracy and completeness, and I agree with the above.  Sarina Ser, MD

## 2022-08-23 ENCOUNTER — Encounter: Payer: Self-pay | Admitting: Dermatology

## 2022-08-26 ENCOUNTER — Encounter: Payer: Self-pay | Admitting: Dermatology

## 2022-11-20 ENCOUNTER — Other Ambulatory Visit: Payer: Self-pay | Admitting: Family Medicine

## 2022-11-20 DIAGNOSIS — Z1231 Encounter for screening mammogram for malignant neoplasm of breast: Secondary | ICD-10-CM

## 2022-11-26 ENCOUNTER — Ambulatory Visit: Payer: Medicare HMO | Admitting: Dermatology

## 2022-11-26 DIAGNOSIS — Z8719 Personal history of other diseases of the digestive system: Secondary | ICD-10-CM

## 2022-11-26 DIAGNOSIS — L570 Actinic keratosis: Secondary | ICD-10-CM

## 2022-11-26 DIAGNOSIS — L578 Other skin changes due to chronic exposure to nonionizing radiation: Secondary | ICD-10-CM

## 2022-11-26 DIAGNOSIS — L820 Inflamed seborrheic keratosis: Secondary | ICD-10-CM | POA: Diagnosis not present

## 2022-11-26 NOTE — Progress Notes (Unsigned)
   Follow-Up Visit   Subjective  Becky Marshall is a 72 y.o. female who presents for the following: Follow-up (Recheck Ak on the left temple treated with LN2 3 months ago ). The patient has spots, moles and lesions to be evaluated, some may be new or changing and the patient has concerns that these could be cancer.  The following portions of the chart were reviewed this encounter and updated as appropriate:   Tobacco  Allergies  Meds  Problems  Med Hx  Surg Hx  Fam Hx     Review of Systems:  No other skin or systemic complaints except as noted in HPI or Assessment and Plan.  Objective  Well appearing patient in no apparent distress; mood and affect are within normal limits.  A focused examination was performed including face. Relevant physical exam findings are noted in the Assessment and Plan.  left temple x 1 Erythematous thin papules/macules with gritty scale.   lateral to left oral comissure Mild pink scaly patch    Assessment & Plan  AK (actinic keratosis) left temple x 1 Actinic keratoses are precancerous spots that appear secondary to cumulative UV radiation exposure/sun exposure over time. They are chronic with expected duration over 1 year. A portion of actinic keratoses will progress to squamous cell carcinoma of the skin. It is not possible to reliably predict which spots will progress to skin cancer and so treatment is recommended to prevent development of skin cancer.  Recommend daily broad spectrum sunscreen SPF 30+ to sun-exposed areas, reapply every 2 hours as needed.  Recommend staying in the shade or wearing long sleeves, sun glasses (UVA+UVB protection) and wide brim hats (4-inch brim around the entire circumference of the hat). Call for new or changing lesions.   Destruction of lesion - left temple x 1 Complexity: simple   Destruction method: cryotherapy   Informed consent: discussed and consent obtained   Timeout:  patient name, date of birth, surgical  site, and procedure verified Lesion destroyed using liquid nitrogen: Yes   Region frozen until ice ball extended beyond lesion: Yes   Outcome: patient tolerated procedure well with no complications   Post-procedure details: wound care instructions given    Inflamed seborrheic keratosis lateral to left oral comissure Isk vs Eczema  Discussed treatment option LN2- pt decline  Can continue Hydrocortisone 2% cream qd- prn   Hx of angular cheilitis lips Resolved   Actinic Damage - chronic, secondary to cumulative UV radiation exposure/sun exposure over time - diffuse scaly erythematous macules with underlying dyspigmentation - Recommend daily broad spectrum sunscreen SPF 30+ to sun-exposed areas, reapply every 2 hours as needed.  - Recommend staying in the shade or wearing long sleeves, sun glasses (UVA+UVB protection) and wide brim hats (4-inch brim around the entire circumference of the hat). - Call for new or changing lesions.  Return in about 1 year (around 11/27/2023) for ISK, Aks .  IMarye Round, CMA, am acting as scribe for Sarina Ser, MD .  Documentation: I have reviewed the above documentation for accuracy and completeness, and I agree with the above.  Sarina Ser, MD

## 2022-11-26 NOTE — Patient Instructions (Addendum)
Cryotherapy Aftercare  Wash gently with soap and water everyday.   Apply Vaseline and Band-Aid daily until healed.     Due to recent changes in healthcare laws, you may see results of your pathology and/or laboratory studies on MyChart before the doctors have had a chance to review them. We understand that in some cases there may be results that are confusing or concerning to you. Please understand that not all results are received at the same time and often the doctors may need to interpret multiple results in order to provide you with the best plan of care or course of treatment. Therefore, we ask that you please give us 2 business days to thoroughly review all your results before contacting the office for clarification. Should we see a critical lab result, you will be contacted sooner.   If You Need Anything After Your Visit  If you have any questions or concerns for your doctor, please call our main line at [phone] and press option 4 to reach your doctor's medical assistant. If no one answers, please leave a voicemail as directed and we will return your call as soon as possible. Messages left after 4 pm will be answered the following business day.   You may also send us a message via MyChart. We typically respond to MyChart messages within 1-2 business days.  For prescription refills, please ask your pharmacy to contact our office. Our fax number is [phone].  If you have an urgent issue when the clinic is closed that cannot wait until the next business day, you can page your doctor at the number below.    Please note that while we do our best to be available for urgent issues outside of office hours, we are not available 24/7.   If you have an urgent issue and are unable to reach us, you may choose to seek medical care at your doctor's office, retail clinic, urgent care center, or emergency room.  If you have a medical emergency, please immediately call 911 or go to the  emergency department.  Pager Numbers  - Dr. Kowalski: [phone]  - Dr. Moye: [phone]  - Dr. Stewart: [phone]  In the event of inclement weather, please call our main line at [phone] for an update on the status of any delays or closures.  Dermatology Medication Tips: Please keep the boxes that topical medications come in in order to help keep track of the instructions about where and how to use these. Pharmacies typically print the medication instructions only on the boxes and not directly on the medication tubes.   If your medication is too expensive, please contact our office at [phone] option 4 or send us a message through MyChart.   We are unable to tell what your co-pay for medications will be in advance as this is different depending on your insurance coverage. However, we may be able to find a substitute medication at lower cost or fill out paperwork to get insurance to cover a needed medication.   If a prior authorization is required to get your medication covered by your insurance company, please allow us 1-2 business days to complete this process.  Drug prices often vary depending on where the prescription is filled and some pharmacies may offer cheaper prices.  The website www.goodrx.com contains coupons for medications through different pharmacies. The prices here do not account for what the cost may be with help from insurance (it may be cheaper with your insurance), but the website can   give you the price if you did not use any insurance.  - You can print the associated coupon and take it with your prescription to the pharmacy.  - You may also stop by our office during regular business hours and pick up a GoodRx coupon card.  - If you need your prescription sent electronically to a different pharmacy, notify our office through [place] MyChart or by phone at [phone] option 4.     Si Usted Necesita Algo Despus de Su Visita  Tambin puede  enviarnos un mensaje a travs de MyChart. Por lo general respondemos a los mensajes de MyChart en el transcurso de 1 a 2 das hbiles.  Para renovar recetas, por favor pida a su farmacia que se ponga en contacto con nuestra oficina. Nuestro nmero de fax es el [phone].  Si tiene un asunto urgente cuando la clnica est cerrada y que no puede esperar hasta el siguiente da hbil, puede llamar/localizar a su doctor(a) al nmero que aparece a continuacin.   Por favor, tenga en cuenta que aunque hacemos todo lo posible para estar disponibles para asuntos urgentes fuera del horario de oficina, no estamos disponibles las 24 horas del da, los 7 das de la semana.   Si tiene un problema urgente y no puede comunicarse con nosotros, puede optar por buscar atencin mdica  en el consultorio de su doctor(a), en una clnica privada, en un centro de atencin urgente o en una sala de emergencias.  Si tiene una emergencia mdica, por favor llame inmediatamente al 911 o vaya a la sala de emergencias.  Nmeros de bper  - Dr. Kowalski: [phone]  - Dra. Moye: [phone]  - Dra. Stewart: [phone]  En caso de inclemencias del tiempo, por favor llame a nuestra lnea principal al [phone] para una actualizacin sobre el estado de cualquier retraso o cierre.  Consejos para la medicacin en dermatologa: Por favor, guarde las cajas en las que vienen los medicamentos de uso tpico para ayudarle a seguir las instrucciones sobre dnde y cmo usarlos. Las farmacias generalmente imprimen las instrucciones del medicamento slo en las cajas y no directamente en los tubos del medicamento.   Si su medicamento es muy caro, por favor, pngase en contacto con nuestra oficina llamando al [phone] y presione la opcin 4 o envenos un mensaje a travs de MyChart.   No podemos decirle cul ser su copago por los medicamentos por adelantado ya que esto es diferente dependiendo de la cobertura de su seguro.  Sin embargo, es posible que podamos encontrar un medicamento sustituto a menor costo o llenar un formulario para que el seguro cubra el medicamento que se considera necesario.   Si se requiere una autorizacin previa para que su compaa de seguros cubra su medicamento, por favor permtanos de 1 a 2 das hbiles para completar este proceso.  Los precios de los medicamentos varan con frecuencia dependiendo del lugar de dnde se surte la receta y alguna farmacias pueden ofrecer precios ms baratos.  El sitio web www.goodrx.com tiene cupones para medicamentos de diferentes farmacias. Los precios aqu no tienen en cuenta lo que podra costar con la ayuda del seguro (puede ser ms barato con su seguro), pero el sitio web puede darle el precio si no utiliz ningn seguro.  - Puede imprimir el cupn correspondiente y llevarlo con su receta a la farmacia.  - Tambin puede pasar por nuestra oficina durante el horario de atencin regular y recoger una tarjeta de cupones de GoodRx.  -   Si necesita que su receta se enve electrnicamente a una farmacia diferente, informe a nuestra oficina a travs de MyChart de [place] o por telfono llamando al [phone] y presione la opcin 4.  

## 2022-11-27 ENCOUNTER — Encounter: Payer: Self-pay | Admitting: Dermatology

## 2022-12-02 ENCOUNTER — Ambulatory Visit
Admission: RE | Admit: 2022-12-02 | Discharge: 2022-12-02 | Disposition: A | Discharge status: Home / Self Care | Payer: Medicare HMO | Source: Ambulatory Visit | Attending: Family Medicine | Admitting: Family Medicine

## 2022-12-02 DIAGNOSIS — Z1231 Encounter for screening mammogram for malignant neoplasm of breast: Secondary | ICD-10-CM

## 2023-01-14 ENCOUNTER — Telehealth: Payer: Self-pay

## 2023-01-14 NOTE — Telephone Encounter (Signed)
Pt calling to see when she was first dx'd c osteopenia/osteoporosis; needs info for her daughter.  [phone]  Adv pt she had a baseline dexa scan 08/20/2010 that came back osteopenia.

## 2023-07-14 ENCOUNTER — Other Ambulatory Visit: Payer: Self-pay | Admitting: Family Medicine

## 2023-07-14 DIAGNOSIS — M810 Age-related osteoporosis without current pathological fracture: Secondary | ICD-10-CM

## 2023-09-22 ENCOUNTER — Ambulatory Visit
Admission: RE | Admit: 2023-09-22 | Discharge: 2023-09-22 | Disposition: A | Discharge status: Home / Self Care | Payer: Medicare HMO | Source: Ambulatory Visit | Attending: Family Medicine | Admitting: Family Medicine

## 2023-09-22 ENCOUNTER — Other Ambulatory Visit: Payer: Medicare HMO

## 2023-09-22 DIAGNOSIS — M810 Age-related osteoporosis without current pathological fracture: Secondary | ICD-10-CM | POA: Insufficient documentation

## 2023-12-02 ENCOUNTER — Ambulatory Visit: Payer: Medicare HMO | Admitting: Dermatology

## 2023-12-24 ENCOUNTER — Encounter: Payer: Self-pay | Admitting: Dermatology

## 2023-12-24 ENCOUNTER — Ambulatory Visit: Payer: Medicare HMO | Admitting: Dermatology

## 2023-12-24 DIAGNOSIS — L821 Other seborrheic keratosis: Secondary | ICD-10-CM

## 2023-12-24 DIAGNOSIS — L719 Rosacea, unspecified: Secondary | ICD-10-CM | POA: Diagnosis not present

## 2023-12-24 DIAGNOSIS — Z7189 Other specified counseling: Secondary | ICD-10-CM

## 2023-12-24 DIAGNOSIS — Z872 Personal history of diseases of the skin and subcutaneous tissue: Secondary | ICD-10-CM

## 2023-12-24 DIAGNOSIS — L814 Other melanin hyperpigmentation: Secondary | ICD-10-CM

## 2023-12-24 MED ORDER — METRONIDAZOLE 0.75 % EX CREA: TOPICAL_CREAM | CUTANEOUS | 5 refills | Status: AC

## 2023-12-24 NOTE — Patient Instructions (Addendum)
 Start Metronidazole  0.75% cream twice daily to face for rosacea.     Counseling for BBL / IPL / Laser and Coordination of Care Discussed the treatment option of Broad Band Light (BBL) /Intense Pulsed Light (IPL)/ Laser for skin discoloration, including brown spots and redness.  Typically we recommend at least 1-3 treatment sessions about 5-8 weeks apart for best results.  Cannot have tanned skin when BBL performed, and regular use of sunscreen/photoprotection is advised after the procedure to help maintain results. The patient's condition may also require maintenance treatments in the future.  The fee for BBL / laser treatments is $350 per treatment session for the whole face.  A fee can be quoted for other parts of the body.  Insurance typically does not pay for BBL/laser treatments and therefore the fee is an out-of-pocket cost. Recommend prophylactic valtrex treatment. Once scheduled for procedure, will send Rx in prior to patient's appointment.     Recommend daily broad spectrum sunscreen SPF 30+ to sun-exposed areas, reapply every 2 hours as needed. Call for new or changing lesions.  Staying in the shade or wearing long sleeves, sun glasses (UVA+UVB protection) and wide brim hats (4-inch brim around the entire circumference of the hat) are also recommended for sun protection.     Melanoma ABCDEs  Melanoma is the most dangerous type of skin cancer, and is the leading cause of death from skin disease.  You are more likely to develop melanoma if you: Have light-colored skin, light-colored eyes, or red or blond hair Spend a lot of time in the sun Tan regularly, either outdoors or in a tanning bed Have had blistering sunburns, especially during childhood Have a close family member who has had a melanoma Have atypical moles or large birthmarks  Early detection of melanoma is key since treatment is typically straightforward and cure rates are extremely high if we catch it early.   The first  sign of melanoma is often a change in a mole or a new dark spot.  The ABCDE system is a way of remembering the signs of melanoma.  A for asymmetry:  The two halves do not match. B for border:  The edges of the growth are irregular. C for color:  A mixture of colors are present instead of an even brown color. D for diameter:  Melanomas are usually (but not always) greater than 6mm - the size of a pencil eraser. E for evolution:  The spot keeps changing in size, shape, and color.  Please check your skin once per month between visits. You can use a small mirror in front and a large mirror behind you to keep an eye on the back side or your body.   If you see any new or changing lesions before your next follow-up, please call to schedule a visit.  Please continue daily skin protection including broad spectrum sunscreen SPF 30+ to sun-exposed areas, reapplying every 2 hours as needed when you're outdoors.   Staying in the shade or wearing long sleeves, sun glasses (UVA+UVB protection) and wide brim hats (4-inch brim around the entire circumference of the hat) are also recommended for sun protection.       Due to recent changes in healthcare laws, you may see results of your pathology and/or laboratory studies on MyChart before the doctors have had a chance to review them. We understand that in some cases there may be results that are confusing or concerning to you. Please understand that not all results are  received at the same time and often the doctors may need to interpret multiple results in order to provide you with the best plan of care or course of treatment. Therefore, we ask that you please give us  2 business days to thoroughly review all your results before contacting the office for clarification. Should we see a critical lab result, you will be contacted sooner.   If You Need Anything After Your Visit  If you have any questions or concerns for your doctor, please call our main line at  [phone] and press option 4 to reach your doctor's medical assistant. If no one answers, please leave a voicemail as directed and we will return your call as soon as possible. Messages left after 4 pm will be answered the following business day.   You may also send us  a message via MyChart. We typically respond to MyChart messages within 1-2 business days.  For prescription refills, please ask your pharmacy to contact our office. Our fax number is [phone].  If you have an urgent issue when the clinic is closed that cannot wait until the next business day, you can page your doctor at the number below.    Please note that while we do our best to be available for urgent issues outside of office hours, we are not available 24/7.   If you have an urgent issue and are unable to reach us , you may choose to seek medical care at your doctor's office, retail clinic, urgent care center, or emergency room.  If you have a medical emergency, please immediately call 911 or go to the emergency department.  Pager Numbers  - Dr. Hester: [phone]  - Dr. Jackquline: [phone]  - Dr. Claudene: [phone]   In the event of inclement weather, please call our main line at [phone] for an update on the status of any delays or closures.  Dermatology Medication Tips: Please keep the boxes that topical medications come in in order to help keep track of the instructions about where and how to use these. Pharmacies typically print the medication instructions only on the boxes and not directly on the medication tubes.   If your medication is too expensive, please contact our office at [phone] option 4 or send us  a message through MyChart.   We are unable to tell what your co-pay for medications will be in advance as this is different depending on your insurance coverage. However, we may be able to find a substitute medication at lower cost or fill out paperwork to get insurance to cover a needed  medication.   If a prior authorization is required to get your medication covered by your insurance company, please allow us  1-2 business days to complete this process.  Drug prices often vary depending on where the prescription is filled and some pharmacies may offer cheaper prices.  The website www.goodrx.com contains coupons for medications through different pharmacies. The prices here do not account for what the cost may be with help from insurance (it may be cheaper with your insurance), but the website can give you the price if you did not use any insurance.  - You can print the associated coupon and take it with your prescription to the pharmacy.  - You may also stop by our office during regular business hours and pick up a GoodRx coupon card.  - If you need your prescription sent electronically to a different pharmacy, notify our office through [facility] or by phone at [phone] option  4.     Si Usted Necesita Algo Despus de Su Visita  Tambin puede enviarnos un mensaje a travs de Clinical Cytogeneticist. Por lo general respondemos a los mensajes de MyChart en el transcurso de 1 a 2 das hbiles.  Para renovar recetas, por favor pida a su farmacia que se ponga en contacto con nuestra oficina. Randi lakes de fax es [place] [phone].  Si tiene un asunto urgente cuando la clnica est cerrada y que no puede esperar hasta el siguiente da hbil, puede llamar/localizar a su doctor(a) al nmero que aparece a continuacin.   Por favor, tenga en cuenta que aunque hacemos todo lo posible para estar disponibles para asuntos urgentes fuera del horario de [place], no estamos disponibles las 24 horas del da, los 7 [address]  [phone] de la [place].   Si tiene un problema urgente y no puede comunicarse con nosotros, puede optar por buscar atencin mdica  en el consultorio de su doctor(a), en una clnica privada, en un centro de atencin urgente o en una sala de emergencias.  Si tiene engineer, drilling,  por favor llame inmediatamente al 911 o vaya a la sala de emergencias.  Nmeros de bper  - Dr. Hester: [phone]  - Dra. Jackquline: [phone]  - Dr. Claudene: [phone]   En caso de inclemencias del tiempo, por favor llame a landry capes principal al [phone] para una actualizacin sobre el [place] de cualquier retraso o cierre.  Consejos para la medicacin en dermatologa: Por favor, guarde las cajas en las que vienen los medicamentos de uso tpico para ayudarle a seguir las instrucciones sobre dnde y cmo usarlos. Las farmacias generalmente imprimen las instrucciones del medicamento slo en las cajas y no directamente en los tubos del [place].   Si su medicamento es muy caro, por favor, pngase en contacto con landry rieger llamando al [phone] y presione la opcin 4 o envenos un mensaje a travs de Clinical Cytogeneticist.   No podemos decirle cul ser su copago por los medicamentos por adelantado ya que esto es diferente dependiendo de la cobertura de su seguro. Sin embargo, es posible que podamos encontrar un medicamento sustituto a audiological scientist un formulario para que el seguro cubra el medicamento que se considera necesario.   Si se requiere una autorizacin previa para que su compaa de seguros cubra su medicamento, por favor permtanos de 1 a 2 das hbiles para completar este proceso.  Los precios de los medicamentos varan con frecuencia dependiendo del environmental consultant de dnde se surte la receta y alguna farmacias pueden ofrecer precios ms baratos.  El sitio web www.goodrx.com tiene cupones para medicamentos de health and safety inspector. Los precios aqu no tienen en cuenta lo que podra costar con la ayuda del seguro (puede ser ms barato con su seguro), pero el sitio web puede darle el precio si no utiliz tourist information centre manager.  - Puede imprimir el cupn correspondiente y llevarlo con su receta a la farmacia.  - Tambin puede pasar por nuestra oficina durante el horario de atencin  regular y education officer, museum una tarjeta de cupones de GoodRx.  - Si necesita que su receta se enve electrnicamente a una farmacia diferente, informe a nuestra oficina a travs de MyChart de [place] o por telfono llamando al [phone] y presione la opcin 4.

## 2023-12-24 NOTE — Progress Notes (Signed)
 Follow-Up Visit   Subjective  Becky Marshall is a 73 y.o. female who presents for the following: yearly sun exposed exam. Hx of AKs and ISKs. No personal Hx of skin cancer.  Area of concern at left dorsal hand.  Rosacea recheck. Has used Skin Medicinals Triple Cream and Metronidazole  gel in the past. Flaring today.  The patient has spots, moles and lesions to be evaluated, some may be new or changing and the patient may have concern these could be cancer.   The following portions of the chart were reviewed this encounter and updated as appropriate: medications, allergies, medical history  Review of Systems:  No other skin or systemic complaints except as noted in HPI or Assessment and Plan.  Objective  Well appearing patient in no apparent distress; mood and affect are within normal limits.  A focused examination was performed of the following areas: Face, ears, neck, arms, hands  Relevant exam findings are noted in the Assessment and Plan.    Assessment & Plan   SEBORRHEIC KERATOSIS - Stuck-on, waxy, tan-brown papules and/or plaques at hands, arms, face - Benign-appearing - Discussed benign etiology and prognosis. - Observe - Call for any changes  LENTIGINES Exam: scattered tan macules at hands, arms, face Due to sun exposure Treatment Plan: Benign-appearing, observe. Recommend daily broad spectrum sunscreen SPF 30+ to sun-exposed areas, reapply every 2 hours as needed.  Call for any changes   ROSACEA Exam Mid face erythema with telangiectasias   Chronic and persistent condition with duration or expected duration over one year. Condition is symptomatic/ bothersome to patient. Not currently at goal.   Rosacea is a chronic progressive skin condition usually affecting the face of adults, causing redness and/or acne bumps. It is treatable but not curable. It sometimes affects the eyes (ocular rosacea) as well. It may respond to topical and/or systemic medication and can  flare with stress, sun exposure, alcohol, exercise, topical steroids (including hydrocortisone/cortisone 10) and some foods.  Daily application of broad spectrum spf 30+ sunscreen to face is recommended to reduce flares.  Patient denies grittiness of the eyes  Treatment Plan  Start Metronidazole  0.75% cream twice daily to face for rosacea.   Has tried metronidazole  gel  and SM triple cream in the past but hasn't used anything recently.   Discussed BBL for facial redness  Counseling for BBL / IPL / Laser and Coordination of Care Discussed the treatment option of Broad Band Light (BBL) /Intense Pulsed Light (IPL)/ Laser for skin discoloration, including brown spots and redness.  Typically we recommend at least 1-3 treatment sessions about 5-8 weeks apart for best results.  Cannot have tanned skin when BBL performed, and regular use of sunscreen/photoprotection is advised after the procedure to help maintain results. The patient's condition may also require maintenance treatments in the future.  The fee for BBL / laser treatments is $350 per treatment session for the whole face.  A fee can be quoted for other parts of the body.  Insurance typically does not pay for BBL/laser treatments and therefore the fee is an out-of-pocket cost. Recommend prophylactic valtrex treatment. Once scheduled for procedure, will send Rx in prior to patient's appointment.       Return in about 1 year (around 12/23/2024) for AK Follow Up, ISK Follow Up, Rosacea Follow Up.  I, Kate Fought, CMA, am acting as scribe for Rexene Rattler, MD.   Documentation: I have reviewed the above documentation for accuracy and completeness, and I agree with  the above.  Rexene Rattler, MD

## 2024-06-29 ENCOUNTER — Other Ambulatory Visit: Payer: Self-pay | Admitting: Family Medicine

## 2024-06-29 DIAGNOSIS — Z1231 Encounter for screening mammogram for malignant neoplasm of breast: Secondary | ICD-10-CM

## 2024-07-08 ENCOUNTER — Other Ambulatory Visit: Payer: Self-pay | Admitting: Family Medicine

## 2024-07-08 DIAGNOSIS — I10 Essential (primary) hypertension: Secondary | ICD-10-CM

## 2024-07-08 DIAGNOSIS — E7800 Pure hypercholesterolemia, unspecified: Secondary | ICD-10-CM

## 2024-07-08 DIAGNOSIS — R7303 Prediabetes: Secondary | ICD-10-CM

## 2024-07-15 ENCOUNTER — Ambulatory Visit
Admission: RE | Admit: 2024-07-15 | Discharge: 2024-07-15 | Disposition: A | Discharge status: Home / Self Care | Payer: Self-pay | Source: Ambulatory Visit | Attending: Family Medicine | Admitting: Family Medicine

## 2024-07-15 DIAGNOSIS — R7303 Prediabetes: Secondary | ICD-10-CM | POA: Insufficient documentation

## 2024-07-15 DIAGNOSIS — I10 Essential (primary) hypertension: Secondary | ICD-10-CM | POA: Insufficient documentation

## 2024-07-15 DIAGNOSIS — E7800 Pure hypercholesterolemia, unspecified: Secondary | ICD-10-CM | POA: Insufficient documentation

## 2024-07-27 ENCOUNTER — Ambulatory Visit
Admission: RE | Admit: 2024-07-27 | Discharge: 2024-07-27 | Disposition: A | Discharge status: Home / Self Care | Source: Ambulatory Visit | Attending: Family Medicine | Admitting: Family Medicine

## 2024-07-27 DIAGNOSIS — Z1231 Encounter for screening mammogram for malignant neoplasm of breast: Secondary | ICD-10-CM | POA: Diagnosis present

## 2024-10-12 ENCOUNTER — Other Ambulatory Visit: Payer: Self-pay

## 2024-10-12 ENCOUNTER — Encounter: Payer: Self-pay | Admitting: Internal Medicine

## 2024-10-12 ENCOUNTER — Ambulatory Visit
Admission: RE | Admit: 2024-10-12 | Discharge: 2024-10-12 | Disposition: A | Discharge status: Home / Self Care | Attending: Internal Medicine | Admitting: Internal Medicine

## 2024-10-12 ENCOUNTER — Encounter
Admission: RE | Disposition: A | Discharge status: Home / Self Care | Payer: Self-pay | Source: Home / Self Care | Attending: Internal Medicine

## 2024-10-12 DIAGNOSIS — R9439 Abnormal result of other cardiovascular function study: Secondary | ICD-10-CM

## 2024-10-12 DIAGNOSIS — Z79899 Other long term (current) drug therapy: Secondary | ICD-10-CM | POA: Diagnosis not present

## 2024-10-12 DIAGNOSIS — I2510 Atherosclerotic heart disease of native coronary artery without angina pectoris: Secondary | ICD-10-CM | POA: Diagnosis not present

## 2024-10-12 DIAGNOSIS — Z8249 Family history of ischemic heart disease and other diseases of the circulatory system: Secondary | ICD-10-CM | POA: Insufficient documentation

## 2024-10-12 DIAGNOSIS — I2584 Coronary atherosclerosis due to calcified coronary lesion: Secondary | ICD-10-CM | POA: Insufficient documentation

## 2024-10-12 DIAGNOSIS — I10 Essential (primary) hypertension: Secondary | ICD-10-CM | POA: Diagnosis not present

## 2024-10-12 DIAGNOSIS — E785 Hyperlipidemia, unspecified: Secondary | ICD-10-CM | POA: Insufficient documentation

## 2024-10-12 HISTORY — PX: LEFT HEART CATH AND CORONARY ANGIOGRAPHY: CATH118249

## 2024-10-12 SURGERY — LEFT HEART CATH AND CORONARY ANGIOGRAPHY
Anesthesia: Moderate Sedation | Laterality: Left

## 2024-10-12 MED ORDER — VERAPAMIL HCL 2.5 MG/ML IV SOLN
INTRAVENOUS | Status: DC | PRN
Start: 2024-10-12 — End: 2024-10-12
  Administered 2024-10-12 (×2): 2.5 mg via INTRA_ARTERIAL

## 2024-10-12 MED ORDER — ACETAMINOPHEN 325 MG PO TABS
650.0000 mg | ORAL_TABLET | ORAL | Status: DC | PRN
Start: 2024-10-12 — End: 2024-10-12

## 2024-10-12 MED ORDER — ASPIRIN 81 MG PO CHEW
81.0000 mg | CHEWABLE_TABLET | ORAL | Status: AC
  Administered 2024-10-12: 81 mg via ORAL

## 2024-10-12 MED ORDER — SODIUM CHLORIDE 0.9% FLUSH: 3.0000 mL | INTRAVENOUS | Status: DC | PRN

## 2024-10-12 MED ORDER — HEPARIN (PORCINE) IN NACL 1000-0.9 UT/500ML-% IV SOLN
INTRAVENOUS | Status: AC
  Filled 2024-10-12: qty 1000

## 2024-10-12 MED ORDER — HEPARIN (PORCINE) IN NACL 1000-0.9 UT/500ML-% IV SOLN
INTRAVENOUS | Status: DC | PRN
  Administered 2024-10-12 (×2): 500 mL

## 2024-10-12 MED ORDER — ROSUVASTATIN CALCIUM 40 MG PO TABS: 40.0000 mg | ORAL_TABLET | Freq: Every day | ORAL | 0 refills | Status: AC

## 2024-10-12 MED ORDER — FREE WATER: 500.0000 mL | Freq: Once | Status: DC

## 2024-10-12 MED ORDER — HEPARIN SODIUM (PORCINE) 1000 UNIT/ML IJ SOLN
INTRAMUSCULAR | Status: DC | PRN
  Administered 2024-10-12: 2500 [IU] via INTRAVENOUS

## 2024-10-12 MED ORDER — MIDAZOLAM HCL 2 MG/2ML IJ SOLN
INTRAMUSCULAR | Status: AC
Start: 2024-10-12 — End: 2024-10-12
  Filled 2024-10-12: qty 2

## 2024-10-12 MED ORDER — IOHEXOL 300 MG/ML  SOLN
INTRAMUSCULAR | Status: DC | PRN
  Administered 2024-10-12: 150 mL

## 2024-10-12 MED ORDER — ONDANSETRON HCL 4 MG/2ML IJ SOLN
4.0000 mg | Freq: Four times a day (QID) | INTRAMUSCULAR | Status: DC | PRN
Start: 2024-10-12 — End: 2024-10-12

## 2024-10-12 MED ORDER — HYDRALAZINE HCL 20 MG/ML IJ SOLN: 10.0000 mg | INTRAMUSCULAR | Status: DC | PRN

## 2024-10-12 MED ORDER — SODIUM CHLORIDE 0.9 % IV SOLN: 250.0000 mL | INTRAVENOUS | Status: DC | PRN

## 2024-10-12 MED ORDER — LIDOCAINE HCL 1 % IJ SOLN
INTRAMUSCULAR | Status: AC
Start: 2024-10-12 — End: 2024-10-12
  Filled 2024-10-12: qty 20

## 2024-10-12 MED ORDER — ISOSORBIDE MONONITRATE ER 30 MG PO TB24: 30.0000 mg | ORAL_TABLET | Freq: Every day | ORAL | 0 refills | Status: AC

## 2024-10-12 MED ORDER — ASPIRIN 81 MG PO CHEW
CHEWABLE_TABLET | ORAL | Status: AC
  Filled 2024-10-12: qty 1

## 2024-10-12 MED ORDER — VERAPAMIL HCL 2.5 MG/ML IV SOLN
INTRAVENOUS | Status: AC
  Filled 2024-10-12: qty 2

## 2024-10-12 MED ORDER — LIDOCAINE HCL (PF) 1 % IJ SOLN
INTRAMUSCULAR | Status: DC | PRN
  Administered 2024-10-12: 5 mL

## 2024-10-12 MED ORDER — HEPARIN SODIUM (PORCINE) 1000 UNIT/ML IJ SOLN
INTRAMUSCULAR | Status: AC
  Filled 2024-10-12: qty 10

## 2024-10-12 MED ORDER — MIDAZOLAM HCL (PF) 2 MG/2ML IJ SOLN
INTRAMUSCULAR | Status: DC | PRN
  Administered 2024-10-12: 1 mg via INTRAVENOUS
  Administered 2024-10-12: .5 mg via INTRAVENOUS

## 2024-10-12 MED ORDER — FENTANYL CITRATE (PF) 100 MCG/2ML IJ SOLN
INTRAMUSCULAR | Status: DC | PRN
  Administered 2024-10-12: 50 ug via INTRAVENOUS
  Administered 2024-10-12: 25 ug via INTRAVENOUS

## 2024-10-12 MED ORDER — SODIUM CHLORIDE 0.9% FLUSH: 3.0000 mL | Freq: Two times a day (BID) | INTRAVENOUS | Status: DC

## 2024-10-12 MED ORDER — SODIUM CHLORIDE 0.9 % IV SOLN: Freq: Once | INTRAVENOUS | Status: AC

## 2024-10-12 MED ORDER — FENTANYL CITRATE (PF) 100 MCG/2ML IJ SOLN
INTRAMUSCULAR | Status: AC
  Filled 2024-10-12: qty 2

## 2024-10-12 SURGICAL SUPPLY — 13 items
CATH INFINITI 5 FR 3DRC (CATHETERS) IMPLANT
CATH INFINITI 5 FR JL3.5 (CATHETERS) IMPLANT
CATH INFINITI 5 FR MPA2 (CATHETERS) IMPLANT
CATH INFINITI JR4 5F (CATHETERS) IMPLANT
DEVICE RAD TR BAND REGULAR (VASCULAR PRODUCTS) IMPLANT
DRAPE BRACHIAL (DRAPES) IMPLANT
GLIDESHEATH SLEND SS 6F .021 (SHEATH) IMPLANT
GUIDEWIRE INQWIRE 1.5J.035X260 (WIRE) IMPLANT
KIT SYRINGE INJ CVI SPIKEX1 (MISCELLANEOUS) IMPLANT
PACK CARDIAC CATH (CUSTOM PROCEDURE TRAY) ×1 IMPLANT
SET ATX-X65L (MISCELLANEOUS) IMPLANT
STATION PROTECTION PRESSURIZED (MISCELLANEOUS) IMPLANT
WIRE HITORQ VERSACORE ST 145CM (WIRE) IMPLANT

## 2024-10-12 NOTE — Discharge Instructions (Signed)
 Radial Site Care Refer to this sheet in the next few weeks. These instructions provide you with information about caring for yourself after your procedure. Your health care provider may also give you more specific instructions. Your treatment has been planned according to current medical practices, but problems sometimes occur. Call your health care provider if you have any problems or questions after your procedure. What can I expect after the procedure? After your procedure, it is typical to have the following: Bruising at the radial site that usually fades within 1-2 weeks. Blood collecting in the tissue (hematoma) that may be painful to the touch. It should usually decrease in size and tenderness within 1-2 weeks.  Follow these instructions at home: Take medicines only as directed by your health care provider. If you are on a medication called Metformin please do not take for 48 hours after your procedure. Over the next 48hrs please increase your fluid intake of water and non caffeine beverages to flush the contrast dye out of your system.  You may shower 24 hours after the procedure  Leave your bandage on and gently wash the site with plain soap and water. Pat the area dry with a clean towel. Do not rub the site, because this may cause bleeding.  Remove your dressing 48hrs after your procedure and leave open to air.  Do not submerge your site in water for 7 days. This includes swimming and washing dishes.  Check your insertion site every day for redness, swelling, or drainage. Do not apply powder or lotion to the site. Do not flex or bend the affected arm for 24 hours or as directed by your health care provider. Do not push or pull heavy objects with the affected arm for 24 hours or as directed by your health care provider. Do not lift over 10 lb (4.5 kg) for 5 days after your procedure or as directed by your health care provider. Ask your health care provider when it is okay to: Return to  work or school. Resume usual physical activities or sports. Resume sexual activity. Do not drive home if you are discharged the same day as the procedure. Have someone else drive you. You may drive 48 hours after the procedure Do not operate machinery or power tools for 24 hours after the procedure. If your procedure was done as an outpatient procedure, which means that you went home the same day as your procedure, a responsible adult should be with you for the first 24 hours after you arrive home. Keep all follow-up visits as directed by your health care provider. This is important. Contact a health care provider if: You have a fever. You have chills. You have increased bleeding from the radial site. Hold pressure on the site. Get help right away if: You have unusual pain at the radial site. You have redness, warmth, or swelling at the radial site. You have drainage (other than a small amount of blood on the dressing) from the radial site. The radial site is bleeding, and the bleeding does not stop after 15 minutes of holding steady pressure on the site. Your arm or hand becomes pale, cool, tingly, or numb. This information is not intended to replace advice given to you by your health care provider. Make sure you discuss any questions you have with your health care provider. Document Released: 12/06/2010 Document Revised: 04/10/2016 Document Reviewed: 05/22/2014 Elsevier Interactive Patient Education  2018 [employer].

## 2024-10-14 LAB — CARDIAC CATHETERIZATION: Cath EF Quantitative: 55 %

## 2024-10-17 ENCOUNTER — Encounter: Payer: Self-pay | Admitting: Internal Medicine

## 2024-12-26 ENCOUNTER — Ambulatory Visit: Payer: Medicare HMO | Admitting: Dermatology

## 2025-01-31 ENCOUNTER — Ambulatory Visit: Admitting: Dermatology
